# Patient Record
Sex: Female | Born: 1990 | Race: White | Hispanic: No | Marital: Single | Smoking: Never smoker
Health system: Southern US, Community
[De-identification: ages and names within clinical notes are randomized; demographics above are authoritative.]

## PROBLEM LIST (undated history)

## (undated) DIAGNOSIS — S82899A Other fracture of unspecified lower leg, initial encounter for closed fracture: Secondary | ICD-10-CM

## (undated) DIAGNOSIS — S82891A Other fracture of right lower leg, initial encounter for closed fracture: Secondary | ICD-10-CM

## (undated) HISTORY — DX: Other fracture of unspecified lower leg, initial encounter for closed fracture: S82.899A

## (undated) HISTORY — DX: Other fracture of right lower leg, initial encounter for closed fracture: S82.891A

---
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
## 2012-02-05 ENCOUNTER — Encounter: Payer: Self-pay | Admitting: Family Medicine

## 2012-02-05 ENCOUNTER — Ambulatory Visit (INDEPENDENT_AMBULATORY_CARE_PROVIDER_SITE_OTHER): Payer: Commercial Managed Care - PPO | Admitting: Family Medicine

## 2012-02-05 VITALS — BP 129/83 | HR 69 | Temp 97.5°F | Ht 64.0 in | Wt 128.2 lb

## 2012-02-05 DIAGNOSIS — R21 Rash and other nonspecific skin eruption: Secondary | ICD-10-CM

## 2012-02-05 MED ORDER — TRIAMCINOLONE ACETONIDE 0.1 % EX CREA: TOPICAL_CREAM | Freq: Two times a day (BID) | CUTANEOUS | Status: DC

## 2012-02-05 NOTE — Assessment & Plan Note (Addendum)
Rash consistent with keratosis pilaris, however pruritis not consistent with this.  Doesn't look like bug bites. Will treat as KP, recommend claritin daily for itching, benadryl at night, TCI cream for pruritic patches. If roughness continues with improved itching, consider urea cream. rec no shaving for 2 wks while on steroid, rec using disposable razors afterwards.

## 2012-02-05 NOTE — Progress Notes (Signed)
  Subjective:    Patient ID: Kim Zamora, female    DOB: 01-02-1991, 21 y.o.   MRN: [id number]  HPI CC: leg rash  Itchy bumps on legs, started beginning of July.  Though allergic to something, but hasn't noticed worse with any specific exposure.  Changed razors but that didn't help.  Tried cortisone 10 which did help initially with itch.  Tried benadryl oral which did help itching and helped her sleep.  No new lotions, detergents, soaps, shampoos.  No outside exposures.  Is outside a lot.  No new medicines, no new foods.  Went camping father's day weekend, but not since.  No home contacts with similar rash.  Limited to right above knee and downward to ankles, not on feet.  Not on soles of feet.  Not on upper extremities, trunk ,abd , back, chest or face.  Rash tends to clear then returns.  No viral illness, fevers/chills, joint pains, oral lesions, abd pain, nausea.  Is CNA.  Review of Systems Per HPI    Objective:   Physical Exam WDWN CF NAD Diffuse faint pruritic papular rash bilateral lower legs throughout to ankles.  Spares palms/soles.  Some rough papules on upper arms but not pruritic.    Assessment & Plan:

## 2012-02-05 NOTE — Patient Instructions (Signed)
I think this is something called keratosis pilaris. Use claritin daily for itch, may use some benadryl at night. Use triacinolone cream for itchy spots, but no more than 2 weeks in a row.   If not improving, let me know.

## 2012-07-02 ENCOUNTER — Ambulatory Visit (INDEPENDENT_AMBULATORY_CARE_PROVIDER_SITE_OTHER): Payer: Commercial Managed Care - PPO | Admitting: Family Medicine

## 2012-07-02 ENCOUNTER — Encounter: Payer: Self-pay | Admitting: Family Medicine

## 2012-07-02 VITALS — BP 110/74 | HR 60 | Temp 98.1°F | Ht 64.0 in | Wt 138.2 lb

## 2012-07-02 DIAGNOSIS — S93409A Sprain of unspecified ligament of unspecified ankle, initial encounter: Secondary | ICD-10-CM

## 2012-07-02 NOTE — Patient Instructions (Addendum)
Rest, ice, compression, elevation (RICE) therapy.  Home exercise plan as reviewed. Wear air cast until follow up. Ibuprofen as needed for pain. Follow up in 1 week.. Call sooner if symptoms worsening.

## 2012-07-02 NOTE — Assessment & Plan Note (Signed)
Frequent ankle injuries. Natural history and expected course discussed. Questions answered.  Rest, ice, compression, elevation (RICE) therapy.  Home exercise plan outlined.  NSAIDs per medication orders.  Air cast F/u in 1 week.

## 2012-07-02 NOTE — Progress Notes (Signed)
  Subjective:    Patient ID: Kim Zamora, female    DOB: 23-Jul-1990, 21 y.o.   MRN: [id number]  Ankle Pain  The incident occurred 3 to 5 days ago. The incident occurred at home (during running, ankle rolled latereally, heard a pop, initially was not able to weight bear, now is able to more but is shacky). The injury mechanism was an inversion injury. The pain is present in the left ankle. The quality of the pain is described as aching. The pain is at a severity of 7/10. The pain is moderate. The pain has been constant since onset. Associated symptoms include an inability to bear weight and a loss of motion. Pertinent negatives include no loss of sensation, numbness or tingling. Associated symptoms comments: Initial inabilty to bear weight but can now. The symptoms are aggravated by movement. She has tried ice, NSAIDs, non-weight bearing and rest for the symptoms. The treatment provided moderate relief.   10/2010 seen by Dr. Patsy Lager.. Ankle sprain, conservative measures. 2012 left ankle second degree sprain .  Seen at urgent care. Given boot at that time for 2 weeks.     Review of Systems  Constitutional: Negative for fever and fatigue.  Musculoskeletal: Positive for joint swelling. Negative for myalgias.  Neurological: Negative for tingling, syncope and numbness.       Objective:   Physical Exam  Constitutional: She appears well-developed.  Eyes: Pupils are equal, round, and reactive to light.  Cardiovascular: Normal rate and regular rhythm.  Exam reveals no gallop and no friction rub.   No murmur heard. Pulmonary/Chest: Effort normal and breath sounds normal.  Musculoskeletal:       Left foot: She exhibits decreased range of motion, tenderness and swelling. She exhibits no bony tenderness.       ttp anterior and inferior to lateral malleolus, no posterior pain, ankle joint fairly stable, minimal pain to palpation.          Assessment & Plan:

## 2012-07-22 ENCOUNTER — Ambulatory Visit (INDEPENDENT_AMBULATORY_CARE_PROVIDER_SITE_OTHER): Payer: Commercial Managed Care - PPO | Admitting: Internal Medicine

## 2012-07-22 ENCOUNTER — Encounter: Payer: Self-pay | Admitting: Internal Medicine

## 2012-07-22 VITALS — BP 100/68 | HR 72 | Temp 98.6°F | Wt 125.0 lb

## 2012-07-22 DIAGNOSIS — J029 Acute pharyngitis, unspecified: Secondary | ICD-10-CM

## 2012-07-22 DIAGNOSIS — J039 Acute tonsillitis, unspecified: Secondary | ICD-10-CM | POA: Insufficient documentation

## 2012-07-22 NOTE — Assessment & Plan Note (Signed)
Intermediate risk Rapid test negative Discussed supportive care Will check culture Penicillin if positive

## 2012-07-22 NOTE — Progress Notes (Signed)
  Subjective:    Patient ID: Kim Zamora, female    DOB: 01-Aug-1990, 22 y.o.   MRN: [id number]  HPI Here with sore throat Noticed white patches on the sides this AM (does get "little balls" at times there)  Started yesterday and worse today Pain with talking or swallowing Mostly on right side---can feel node there Slight cough No fever No sig nasal congestion or ear pain  Tried aleve---not really helpful  No current outpatient prescriptions on file prior to visit.    No Known Allergies  Past Medical History  Diagnosis Date  . Ankle fracture, right     just casted  . Fracture, ankle   . Fx ankle     rigth ankle fx --just casted    No past surgical history on file.  No family history on file.  History   Social History  . Marital Status: Single    Spouse Name: N/A    Number of Children: N/A  . Years of Education: N/A   Occupational History  . Student     [employer]   Social History Main Topics  . Smoking status: Never Smoker   . Smokeless tobacco: Never Used  . Alcohol Use: No  . Drug Use: No  . Sexually Active: Not on file   Other Topics Concern  . 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 No distress.  HENT:       No sinus tenderness Mild nasal congestion TMs normal 2-3+ tonsillar enlargement with sig injection and some exudate. No petechiae  Neck: Normal range of motion. Neck supple.       ?slight non tender anterior cervical nodes  Pulmonary/Chest: Effort normal and breath sounds normal. No respiratory distress. She has no wheezes. She has no rales.  Skin: No rash noted.          Assessment & Plan:

## 2012-07-23 ENCOUNTER — Encounter: Payer: Self-pay | Admitting: Internal Medicine

## 2012-07-23 ENCOUNTER — Telehealth: Payer: Self-pay

## 2012-07-23 ENCOUNTER — Ambulatory Visit (INDEPENDENT_AMBULATORY_CARE_PROVIDER_SITE_OTHER): Payer: Commercial Managed Care - PPO | Admitting: Internal Medicine

## 2012-07-23 VITALS — BP 100/70 | HR 96 | Temp 98.9°F | Wt 125.0 lb

## 2012-07-23 DIAGNOSIS — J039 Acute tonsillitis, unspecified: Secondary | ICD-10-CM

## 2012-07-23 MED ORDER — METHYLPREDNISOLONE ACETATE 40 MG/ML IJ SUSP
40.0000 mg | Freq: Once | INTRAMUSCULAR | Status: AC
  Administered 2012-07-23: 40 mg via INTRAMUSCULAR

## 2012-07-23 MED ORDER — CEFTRIAXONE SODIUM 1 G IJ SOLR
1.0000 g | Freq: Once | INTRAMUSCULAR | Status: AC
  Administered 2012-07-23: 1 g via INTRAMUSCULAR

## 2012-07-23 MED ORDER — CLINDAMYCIN HCL 300 MG PO CAPS: 300.0000 mg | ORAL_CAPSULE | Freq: Three times a day (TID) | ORAL | Status: DC

## 2012-07-23 NOTE — Progress Notes (Signed)
  Subjective:    Patient ID: Kim Zamora, female    DOB: [birth year], 22 y.o.   MRN: [id number]  HPI Developed fever last night Woke this AM and her throat was much worse Right side of throat feels "shut off" Tonsil is very large  Able to swallow but very painful Has been spitting out saliva No SOB  No current outpatient prescriptions on file prior to visit.    No Known Allergies  Past Medical History  Diagnosis Date  . Ankle fracture, right     just casted  . Fracture, ankle   . Fx ankle     rigth ankle fx --just casted    No past surgical history on file.  No family history on file.  History   Social History  . Marital Status: Single    Spouse Name: N/A    Number of Children: N/A  . Years of Education: N/A   Occupational History  . Student     [employer]   Social History Main Topics  . Smoking status: Never Smoker   . Smokeless tobacco: Never Used  . Alcohol Use: No  . Drug Use: No  . Sexually Active: Not on file   Other Topics Concern  . 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       Looks mildly anxious  HENT:       Sig progression of right tonsillar enlargement Marked exudate on right No clear abscess on pillar but tonsil extends posteriorly  Neck: Normal range of motion.       Tender right cervical node  Pulmonary/Chest: Effort normal and breath sounds normal. No respiratory distress. She has no wheezes. She has no rales.  Lymphadenopathy:    She has cervical adenopathy.          Assessment & Plan:

## 2012-07-23 NOTE — Addendum Note (Signed)
Addended by: Sueanne Margarita on: 07/23/2012 09:35 AM   Modules accepted: Orders

## 2012-07-23 NOTE — Telephone Encounter (Signed)
Pt seen 07/22/12 with sorethroat, last night pt had fever 100; pt said rt side of throat is more swollen; feels like throat has closed off;having difficulty in swallowing and talking. Pt to see Dr Alphonsus Sias this morning at 8:45 am.

## 2012-07-23 NOTE — Patient Instructions (Addendum)
Please set up ENT referral for today

## 2012-07-23 NOTE — Assessment & Plan Note (Signed)
Marked progression No clear abscess but this needs to be considered Will give rocephin and depomedrol IM rx for clinda  ENT today to see if abscess present and aspirate prn

## 2012-08-25 ENCOUNTER — Other Ambulatory Visit: Payer: Self-pay

## 2012-11-04 ENCOUNTER — Encounter: Payer: Self-pay | Admitting: *Deleted

## 2012-11-04 ENCOUNTER — Encounter: Payer: Self-pay | Admitting: Internal Medicine

## 2012-11-04 ENCOUNTER — Ambulatory Visit (INDEPENDENT_AMBULATORY_CARE_PROVIDER_SITE_OTHER): Payer: Commercial Managed Care - PPO | Admitting: Internal Medicine

## 2012-11-04 VITALS — BP 110/70 | HR 81 | Temp 98.8°F | Wt 115.0 lb

## 2012-11-04 DIAGNOSIS — J039 Acute tonsillitis, unspecified: Secondary | ICD-10-CM

## 2012-11-04 MED ORDER — PENICILLIN V POTASSIUM 500 MG PO TABS: 500.0000 mg | ORAL_TABLET | Freq: Three times a day (TID) | ORAL | Status: DC

## 2012-11-04 NOTE — Assessment & Plan Note (Signed)
Strep is positive this time Fairly classic presentation Will Rx with pcn

## 2012-11-04 NOTE — Progress Notes (Signed)
  Subjective:    Patient ID: Kim Zamora, female    DOB: 19-Mar-1991, 22 y.o.   MRN: [id number]  HPI Having sore throat--at first thought it was allergies Swollen glands on right neck Voice is off Intermittent fever---better with meds  No cough No sig nasal congestion or rhinorrhea No ear pain Some headache Used some nyquil last night  No current outpatient prescriptions on file prior to visit.   No current facility-administered medications on file prior to visit.    No Known Allergies  Past Medical History  Diagnosis Date  . Ankle fracture, right     just casted  . Fracture, ankle   . Fx ankle     rigth ankle fx --just casted    No past surgical history on file.  No family history on file.  History   Social History  . Marital Status: Single    Spouse Name: N/A    Number of Children: N/A  . Years of Education: N/A   Occupational History  .           Social History Main Topics  . Smoking status: Never Smoker   . Smokeless tobacco: Never Used  . Alcohol Use: No  . Drug Use: No  . Sexually Active: Not on file   Other Topics Concern  . 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 No distress.  HENT:  Head: Normocephalic and atraumatic.  Nose: Nose normal.  TMs normal Tonsils 2+ with exudates No asymmetry in tonsillar pillars  Neck: Normal range of motion. Neck supple.  Small slightly tender right ant cervical nodes  Pulmonary/Chest: Effort normal and breath sounds normal. No respiratory distress. She has no wheezes. She has no rales.  Lymphadenopathy:    She has cervical adenopathy.  Skin: No rash noted.          Assessment & Plan:

## 2013-04-21 ENCOUNTER — Emergency Department: Payer: Self-pay | Admitting: Emergency Medicine

## 2013-04-21 LAB — CBC
HGB: 13.5 g/dL (ref 12.0–16.0)
MCH: 30.8 pg (ref 26.0–34.0)
MCHC: 34.4 g/dL (ref 32.0–36.0)
MCV: 89 fL (ref 80–100)
Platelet: 363 10*3/uL (ref 150–440)
RBC: 4.38 10*6/uL (ref 3.80–5.20)
RDW: 12.8 % (ref 11.5–14.5)
WBC: 13.5 10*3/uL — ABNORMAL HIGH (ref 3.6–11.0)

## 2013-04-21 LAB — BASIC METABOLIC PANEL
Calcium, Total: 9.5 mg/dL (ref 8.5–10.1)
Co2: 25 mmol/L (ref 21–32)
EGFR (African American): >60
Glucose: 97 mg/dL (ref 65–99)
Sodium: 138 mmol/L (ref 136–145)

## 2013-04-21 LAB — URINALYSIS, COMPLETE
Bacteria: NONE SEEN
Blood: NEGATIVE
Ketone: NEGATIVE
Leukocyte Esterase: NEGATIVE
Protein: NEGATIVE
RBC,UR: 1 /HPF (ref 0–5)

## 2013-04-21 LAB — PREGNANCY, URINE: Pregnancy Test, Urine: NEGATIVE m[IU]/mL

## 2013-04-22 ENCOUNTER — Ambulatory Visit (INDEPENDENT_AMBULATORY_CARE_PROVIDER_SITE_OTHER): Payer: Commercial Managed Care - PPO | Admitting: Internal Medicine

## 2013-04-22 ENCOUNTER — Encounter: Payer: Self-pay | Admitting: Internal Medicine

## 2013-04-22 VITALS — BP 122/84 | HR 71 | Temp 97.3°F | Ht 64.0 in | Wt 122.1 lb

## 2013-04-22 DIAGNOSIS — A084 Viral intestinal infection, unspecified: Secondary | ICD-10-CM

## 2013-04-22 DIAGNOSIS — R109 Unspecified abdominal pain: Secondary | ICD-10-CM

## 2013-04-22 DIAGNOSIS — A088 Other specified intestinal infections: Secondary | ICD-10-CM

## 2013-04-22 DIAGNOSIS — K3189 Other diseases of stomach and duodenum: Secondary | ICD-10-CM

## 2013-04-22 LAB — GC/CHLAMYDIA PROBE AMP

## 2013-04-22 LAB — WET PREP, GENITAL

## 2013-04-22 NOTE — Progress Notes (Signed)
  Subjective:    Patient ID: Kim Zamora, female    DOB: 11/03/1990, 22 y.o.   MRN: [id number]  HPI    Review of Systems     Objective:   Physical Exam        Assessment & Plan:

## 2013-04-22 NOTE — Patient Instructions (Addendum)
Abdominal Pain Abdominal pain can be caused by many things. Your caregiver decides the seriousness of your pain by an examination and possibly blood tests and X-rays. Many cases can be observed and treated at home. Most abdominal pain is not caused by a disease and will probably improve without treatment. However, in many cases, more time must pass before a clear cause of the pain can be found. Before that point, it may not be known if you need more testing, or if hospitalization or surgery is needed. HOME CARE INSTRUCTIONS   Do not take laxatives unless directed by your caregiver.  Take pain medicine only as directed by your caregiver.  Only take over-the-counter or prescription medicines for pain, discomfort, or fever as directed by your caregiver.  Try a clear liquid diet (broth, tea, or water) for as long as directed by your caregiver. Slowly move to a bland diet as tolerated. SEEK IMMEDIATE MEDICAL CARE IF:   The pain does not go away.  You have a fever.  You keep throwing up (vomiting).  The pain is felt only in portions of the abdomen. Pain in the right side could possibly be appendicitis. In an adult, pain in the left lower portion of the abdomen could be colitis or diverticulitis.  You pass bloody or black tarry stools. MAKE SURE YOU:   Understand these instructions.  Will watch your condition.  Will get help right away if you are not doing well or get worse. Document Released: 04/05/2005 Document Revised: 09/18/2011 Document Reviewed: 02/12/2008 [facility] Patient Information 2014 [place], [place]. Viral Gastroenteritis Viral gastroenteritis is also known as stomach flu. This condition affects the stomach and intestinal tract. It can cause sudden diarrhea and vomiting. The illness typically lasts 3 to 8 days. Most people develop an immune response that eventually gets rid of the virus. While this natural response develops, the virus can make you quite ill. CAUSES  Many  different viruses can cause gastroenteritis, such as rotavirus or noroviruses. You can catch one of these viruses by consuming contaminated food or water. You may also catch a virus by sharing utensils or other personal items with an infected person or by touching a contaminated surface. SYMPTOMS  The most common symptoms are diarrhea and vomiting. These problems can cause a severe loss of body fluids (dehydration) and a body salt (electrolyte) imbalance. Other symptoms may include:  Fever.  Headache.  Fatigue.  Abdominal pain. DIAGNOSIS  Your caregiver can usually diagnose viral gastroenteritis based on your symptoms and a physical exam. A stool sample may also be taken to test for the presence of viruses or other infections. TREATMENT  This illness typically goes away on its own. Treatments are aimed at rehydration. The most serious cases of viral gastroenteritis involve vomiting so severely that you are not able to keep fluids down. In these cases, fluids must be given through an intravenous line (IV). HOME CARE INSTRUCTIONS   Drink enough fluids to keep your urine clear or pale yellow. Drink small amounts of fluids frequently and increase the amounts as tolerated.  Ask your caregiver for specific rehydration instructions.  Avoid:  Foods high in sugar.  Alcohol.  Carbonated drinks.  Tobacco.  Juice.  Caffeine drinks.  Extremely hot or cold fluids.  Fatty, greasy foods.  Too much intake of anything at one time.  Dairy products until 24 to 48 hours after diarrhea stops.  You may consume probiotics. Probiotics are active cultures of beneficial bacteria. They may lessen the amount and  number of diarrheal stools in adults. Probiotics can be found in yogurt with active cultures and in supplements.  Wash your hands well to avoid spreading the virus.  Only take over-the-counter or prescription medicines for pain, discomfort, or fever as directed by your caregiver. Do not  give aspirin to children. Antidiarrheal medicines are not recommended.  Ask your caregiver if you should continue to take your regular prescribed and over-the-counter medicines.  Keep all follow-up appointments as directed by your caregiver. SEEK IMMEDIATE MEDICAL CARE IF:   You are unable to keep fluids down.  You do not urinate at least once every 6 to 8 hours.  You develop shortness of breath.  You notice blood in your stool or vomit. This may look like coffee grounds.  You have abdominal pain that increases or is concentrated in one small area (localized).  You have persistent vomiting or diarrhea.  You have a fever.  The patient is a child younger than 3 months, and he or she has a fever.  The patient is a child older than 3 months, and he or she has a fever and persistent symptoms.  The patient is a child older than 3 months, and he or she has a fever and symptoms suddenly get worse.  The patient is a baby, and he or she has no tears when crying. MAKE SURE YOU:   Understand these instructions.  Will watch your condition.  Will get help right away if you are not doing well or get worse. Document Released: 06/26/2005 Document Revised: 09/18/2011 Document Reviewed: 04/12/2011 [facility] Patient Information 2014 [place], [place].

## 2013-04-22 NOTE — Progress Notes (Signed)
HPI: Pt presents to the office today with one day onset of stomach pain. She was seen in [facility] ER last night and was told she has a viral stomach infection. After receiving IV fluids and medications she was sent home with a prescription for Percocet 5/325mg  and Zofran 4 mg, neither she has filled yet. Pt reports having a urine screen which was negative for UTI or pregnancy and CT scan that was negative for Appendcitis or other gastro complications. Pt denies fever, chills, or body aches. Pt endorses stomach pain to right lower side, nausea, and diarrhea. Pt states her pain 5/10, but not as constant as it was last night.  Past Medical History  Diagnosis Date  . Ankle fracture, right     just casted  . Fracture, ankle   . Fx ankle     rigth ankle fx --just casted     No Known Allergies  History reviewed. No pertinent family history.  ROS:  Constitutional: Denies fever, malaise, fatigue, headache or abrupt weight changes.   Gastrointestinal: Endorses abdominal pain, diarrhea, and nausea.  Denies vomiting, bloating, or blood in the stool.  GU: Denies frequency, urgency, pain with urination, blood in urine, odor or discharge.   No other specific complaints in a complete review of systems (except as listed in HPI above).  PE:  BP 122/84  Pulse 71  Temp(Src) 97.3 F (36.3 C) (Oral)  Ht 5\' 4"  (1.626 m)  Wt 122 lb 2 oz (55.396 kg)  BMI 20.95 kg/m2  SpO2 97% Wt Readings from Last 3 Encounters:  04/22/13 122 lb 2 oz (55.396 kg)  11/04/12 115 lb (52.164 kg) (24%*, Z = -0.70)  07/23/12 125 lb (56.7 kg) (45%*, Z = -0.11)   * Growth percentiles are based on CDC 2-20 Years data.    General: Appears their stated age, well developed, well nourished in NAD. Abdomen: Soft and tender upon palpation to LUQ with referred pain to RLQ. Psoas test negative for pain.  Normal bowel sounds, no bruits noted. No distention or masses noted. Liver, spleen and kidneys non palpable. No CVA  tenderness Psychiatric: Mood and affect normal. Behavior is normal. Judgment and thought content normal.    Assessment and Plan: Viral Gastroenteritis? Increase fluid intake to maintain hydration Try OTC Imodium 2mg  PO tablet as needed for diarrhea Take pain and nausea medications as previously prescribed Monitor for fever, chills, or worsening symptoms, return to ER if symptoms become severe Follow up in 3-5 days if no improvement or worsening of symptoms  Rory Montel S, Student-NP

## 2013-04-22 NOTE — Progress Notes (Signed)
Subjective:    Patient ID: Kim Zamora, female    DOB: 12-22-1990, 22 y.o.   MRN: [id number]  HPI  Pt presents to the clinic today with c/o stomach pain. She did have some associated diarrhea. This started yesterday. She did go the [facility] ER last night for the same. She did have a CT scan of the abdomen which was normal. Her urinalysis was normal. They told her that she had a virus- gave her percocet and zofran and discharged her home. She denies fever, chills or body aches. She reports her symptoms are about the same.   Review of Systems      Past Medical History  Diagnosis Date  . Ankle fracture, right     just casted  . Fracture, ankle   . Fx ankle     rigth ankle fx --just casted    Current Outpatient Prescriptions  Medication Sig Dispense Refill  . penicillin v potassium (VEETID) 500 MG tablet Take 1 tablet (500 mg total) by mouth 3 (three) times daily.  30 tablet  0   No current facility-administered medications for this visit.    No Known Allergies  History reviewed. No pertinent family history.  History   Social History  . Marital Status: Single    Spouse Name: N/A    Number of Children: N/A  . Years of Education: N/A   Occupational History  .           Social History Main Topics  . Smoking status: Never Smoker   . Smokeless tobacco: Never Used  . Alcohol Use: No  . Drug Use: No  . Sexual Activity: Not on file   Other Topics Concern  . 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  Gastrointestinal: Pt reports abdominal pain. Denies bloating, constipation, diarrhea or blood in the stool.  GU: Denies urgency, frequency, pain with urination, burning sensation, blood in urine, odor or discharge.   No other specific complaints in a complete review of systems (except as listed in HPI  above).  Objective:   Physical Exam   BP 122/84  Pulse 71  Temp(Src) 97.3 F (36.3 C) (Oral)  Ht 5\' 4"  (1.626 m)  Wt 122 lb 2 oz (55.396 kg)  BMI 20.95 kg/m2  SpO2 97% Wt Readings from Last 3 Encounters:  04/22/13 122 lb 2 oz (55.396 kg)  11/04/12 115 lb (52.164 kg) (24%*, Z = -0.70)  07/23/12 125 lb (56.7 kg) (45%*, Z = -0.11)   * Growth percentiles are based on CDC 2-20 Years data.    General: Appears her stated age, well developed, well nourished in NAD.  Cardiovascular: Normal rate and rhythm. S1,S2 noted.  No murmur, rubs or gallops noted. No JVD or BLE edema. No carotid bruits noted. Pulmonary/Chest: Normal effort and positive vesicular breath sounds. No respiratory distress. No wheezes, rales or ronchi noted.  Abdomen: Soft and tender in the referred RLQ with palpation of the LLQ. Normal bowel sounds, no bruits noted. No distention or masses noted. Liver, spleen and kidneys non palpable. Negative Psoas.        Assessment & Plan:   Viral Gastroenteritis:  Drink plenty of fluids to avoid dehydration Continue zofran as needed Try no to take percocet unless you really needed Do not have CT scan report- still some concern for appendicitis  Watch for fever, worsening abdominal pain, nausea or vomiting  RTC if you notice any symptoms noted above

## 2013-05-15 ENCOUNTER — Other Ambulatory Visit: Payer: Self-pay

## 2013-07-02 ENCOUNTER — Ambulatory Visit (INDEPENDENT_AMBULATORY_CARE_PROVIDER_SITE_OTHER): Payer: Commercial Managed Care - PPO | Admitting: Family Medicine

## 2013-07-02 ENCOUNTER — Encounter: Payer: Self-pay | Admitting: Family Medicine

## 2013-07-02 VITALS — BP 102/68 | HR 72 | Temp 98.9°F | Ht 64.0 in | Wt 119.8 lb

## 2013-07-02 DIAGNOSIS — J019 Acute sinusitis, unspecified: Secondary | ICD-10-CM

## 2013-07-02 DIAGNOSIS — R0789 Other chest pain: Secondary | ICD-10-CM | POA: Insufficient documentation

## 2013-07-02 DIAGNOSIS — B9689 Other specified bacterial agents as the cause of diseases classified elsewhere: Secondary | ICD-10-CM | POA: Insufficient documentation

## 2013-07-02 DIAGNOSIS — R071 Chest pain on breathing: Secondary | ICD-10-CM

## 2013-07-02 MED ORDER — AMOXICILLIN-POT CLAVULANATE 875-125 MG PO TABS: 1.0000 | ORAL_TABLET | Freq: Two times a day (BID) | ORAL | Status: DC

## 2013-07-02 MED ORDER — GUAIFENESIN-CODEINE 100-10 MG/5ML PO SYRP: 5.0000 mL | ORAL_SOLUTION | Freq: Four times a day (QID) | ORAL | Status: DC | PRN

## 2013-07-02 NOTE — Patient Instructions (Signed)
Drink fluids  Use nasal saline spray  Take augmentin for sinus infection Heat on the rib area that hurts and also ibuprofen over the counter Robitussin with codeine for cough with caution  Update if not starting to improve in a week or if worsening

## 2013-07-02 NOTE — Progress Notes (Signed)
   Subjective:    Patient ID: Kim Zamora, female    DOB: 15-Oct-1990, 22 y.o.   MRN: [id number]  HPI Here with uri symptoms  Congestion for 2 weeks with bad post nasal drip  Cough - feels like it should be productive - but nothing is coming out  Mucous is yellow   No fever  Had some pain in face under her eyes   Throat was sore -improved now  Ears feel fine   L side of ribs feel sore -especially when twisting/ reaching or coughing  They seem to be sticking out more No trauma  occ back pain   Patient Active Problem List   Diagnosis Date Noted  . Ovarian cyst 11/14/2010   Past Medical History  Diagnosis Date  . Ankle fracture, right     just casted  . Fracture, ankle   . Fx ankle     rigth ankle fx --just casted   No past surgical history on file. History  Substance Use Topics  . Smoking status: Never Smoker   . Smokeless tobacco: Never Used  . Alcohol Use: No   No family history on file. Allergies  Allergen Reactions  . Promethazine    No current outpatient prescriptions on file prior to visit.   No current facility-administered medications on file prior to visit.       Review of Systems Review of Systems  Constitutional: Negative for fever, appetite change, fatigue and unexpected weight change.  Eyes: Negative for pain and visual disturbance. ENt pos for cong / rhinorrhea/ sinus pain and ST  Respiratory: Negative for wheeze and shortness of breath.  pos for chest wall pain  Cardiovascular: Negative for palpitations    Gastrointestinal: Negative for nausea, diarrhea and constipation.  Genitourinary: Negative for urgency and frequency.  Skin: Negative for pallor or rash   Neurological: Negative for weakness, light-headedness, numbness and headaches.  Hematological: Negative for adenopathy. Does not bruise/bleed easily.  Psychiatric/Behavioral: Negative for dysphoric mood. The patient is not nervous/anxious.         Objective:   Physical Exam    Constitutional: She appears well-developed and well-nourished. No distress.  HENT:  Head: Normocephalic and atraumatic.  Right Ear: External ear normal.  Left Ear: External ear normal.  Mouth/Throat: Oropharynx is clear and moist. No oropharyngeal exudate.  Nares are injected and congested   Bilateral maxillary sinus tenderness Purulent post nasal drip   Eyes: Conjunctivae and EOM are normal. Pupils are equal, round, and reactive to light. Right eye exhibits no discharge. Left eye exhibits no discharge.  Neck: Normal range of motion. Neck supple.  Cardiovascular: Normal rate and regular rhythm.   Pulmonary/Chest: Effort normal and breath sounds normal. No respiratory distress. She has no wheezes. She has no rales. She exhibits tenderness.  Tender over L lateral ribs without ecchymosis/ skin change or crepitus   Musculoskeletal: She exhibits no edema.  Lymphadenopathy:    She has no cervical adenopathy.  Neurological: She is alert. She displays normal reflexes.  Skin: Skin is warm and dry. No rash noted.  Psychiatric: She has a normal mood and affect.          Assessment & Plan:

## 2013-07-02 NOTE — Progress Notes (Signed)
Pre-visit discussion using our clinic review tool. No additional management support is needed unless otherwise documented below in the visit note.  

## 2013-07-03 NOTE — Assessment & Plan Note (Signed)
On L side  Disc use of warm compress/ nsaid  Reminded to take deep breaths to prev atelectasis Update if not starting to improve in a week or if worsening  -esp if cough or fever

## 2013-07-03 NOTE — Assessment & Plan Note (Signed)
Cover with augmentin  Disc symptomatic care - see instructions on AVS  Update if not starting to improve in a week or if worsening   

## 2013-07-08 ENCOUNTER — Encounter: Payer: Self-pay | Admitting: Family Medicine

## 2013-07-08 ENCOUNTER — Ambulatory Visit (INDEPENDENT_AMBULATORY_CARE_PROVIDER_SITE_OTHER): Payer: Commercial Managed Care - PPO | Admitting: Family Medicine

## 2013-07-08 VITALS — BP 98/66 | HR 97 | Temp 99.9°F | Wt 120.8 lb

## 2013-07-08 DIAGNOSIS — J019 Acute sinusitis, unspecified: Secondary | ICD-10-CM

## 2013-07-08 DIAGNOSIS — B9689 Other specified bacterial agents as the cause of diseases classified elsewhere: Secondary | ICD-10-CM

## 2013-07-08 DIAGNOSIS — R05 Cough: Secondary | ICD-10-CM

## 2013-07-08 LAB — POCT INFLUENZA A/B: Influenza B, POC: NEGATIVE

## 2013-07-08 MED ORDER — BENZONATATE 200 MG PO CAPS: 200.0000 mg | ORAL_CAPSULE | Freq: Three times a day (TID) | ORAL | Status: DC | PRN

## 2013-07-08 NOTE — Patient Instructions (Signed)
Finish the antibiotics, use the tessalon for cough, tylenol/ibuprofen for fever.   Get some rest and drink plenty of fluids.  This should gradually get better.

## 2013-07-08 NOTE — Assessment & Plan Note (Addendum)
Likely improved, with a secondary illness likely.  Flu neg.  Likely a viral process, prevalent in community.  Would use tessalon prn cough and supportive care o/w.  Nontoxic.  D/w pt. She agrees.  Okay for outpatient f/u.  Routed to PCP as FYI.

## 2013-07-08 NOTE — Progress Notes (Signed)
Pre-visit discussion using our clinic review tool. No additional management support is needed unless otherwise documented below in the visit note.  Last OV noted.  Prev sinus pressure and L sided rib pain are gone.  Still on augmentin.  Didn't tolerate the cough medicine, likely from codeine.    Since the last OV, now has a fever up to 101 (started today).  Chills today.  Chest pressure (different from the prev L chest pain prev) with cough.  Cough continues, worse in the meantime.  Cough doesn't hurt her throat, but cough sounds more "raspy".   No vomiting; isolated diarrhea from the augmentin.  No diffuse aches.   Meds, vitals, and allergies reviewed.   ROS: See HPI.  Otherwise, noncontributory.  GEN: nad, alert and oriented HEENT: mucous membranes moist, tm w/o erythema, nasal exam w/o erythema, clear discharge noted,  OP with cobblestoning, sinuses not ttp. NECK: supple w/o LA CV: rrr.   PULM: ctab, no inc wob EXT: no edema SKIN: no acute rash

## 2013-07-28 ENCOUNTER — Encounter: Payer: Self-pay | Admitting: Internal Medicine

## 2013-07-28 ENCOUNTER — Ambulatory Visit (INDEPENDENT_AMBULATORY_CARE_PROVIDER_SITE_OTHER): Payer: Commercial Managed Care - PPO | Admitting: Internal Medicine

## 2013-07-28 VITALS — BP 110/60 | HR 85 | Temp 97.5°F | Ht 64.0 in | Wt 123.0 lb

## 2013-07-28 DIAGNOSIS — Z111 Encounter for screening for respiratory tuberculosis: Secondary | ICD-10-CM

## 2013-07-28 DIAGNOSIS — Z23 Encounter for immunization: Secondary | ICD-10-CM

## 2013-07-28 DIAGNOSIS — Z0000 Encounter for general adult medical examination without abnormal findings: Secondary | ICD-10-CM

## 2013-07-28 NOTE — Assessment & Plan Note (Signed)
Form done Flu shot PPD 4th hep B----3rd wasn't 6 months after first Discussed fitness

## 2013-07-28 NOTE — Progress Notes (Signed)
Subjective:    Patient ID: Kim SalkJessica M Zamora, female    DOB: 01-18-1991, 23 y.o.   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   No current facility-administered medications on file prior to visit.    Allergies  Allergen Reactions  . Codeine     sedation  . Promethazine     Altered mentation    Past Medical History  Diagnosis Date  . Ankle fracture, right     just casted  . Fracture, ankle   . Fx ankle     rigth ankle fx --just casted    No past surgical history on file.  Family History  Problem Relation Age of Onset  . Hypertension Mother     borderline  . Cancer Maternal Grandmother     ovarian  . Cancer Paternal Grandfather     prostate and colon  . Heart disease Neg Hx   . Diabetes Neg Hx     History   Social History  . Marital Status: Single    Spouse Name: N/A    Number of Children: N/A  . Years of Education: N/A   Occupational History  .           Social History Main Topics  . Smoking status: Never Smoker   . Smokeless tobacco: Never Used  . Alcohol Use: No  . Drug Use: No  . Sexual Activity: Not on file   Other Topics Concern  . Not on file   Social History Narrative   [facility]   Then [facility]   Works part time at [employer]   Review of Systems  Constitutional: Negative for fatigue and unexpected weight change.       Wears seat belt No exercise--discussed  HENT: Negative for dental problem, hearing loss and tinnitus.        Regular with dentist  Eyes: Negative for redness and visual disturbance.  Respiratory: Positive for cough. Negative for chest tightness and shortness of breath.        Cough for 6 weeks  Cardiovascular: Positive for chest pain and  palpitations. Negative for leg swelling.       Had chest pain with respiratory illness Occasional racing heart--may cause rest chest pain (a few minutes)  Gastrointestinal: Negative for nausea, vomiting, abdominal pain, constipation and blood in stool.       No heartburn  Endocrine: Negative for cold intolerance and heat intolerance.  Genitourinary: Negative for dysuria, urgency and difficulty urinating.       Gets pain along left flank with micturition (rare). No history of stones Periods are regular. Heavy bleeding and pain. Midol not helpful  Musculoskeletal: Negative for arthralgias, back pain and joint swelling.  Skin: Negative for rash.       No suspicious lesions  Allergic/Immunologic: Negative for environmental allergies.  Neurological: Positive for headaches. Negative for dizziness, syncope, weakness, light-headedness and numbness.       Occasional migraines---uses aleve  Hematological: Negative for adenopathy. Bruises/bleeds easily.       Bruises easy on legs  Psychiatric/Behavioral: Negative for sleep disturbance and dysphoric mood. The patient is not nervous/anxious.        Some stress  Objective:   Physical Exam  Constitutional: She is oriented to person, place, and time. She appears well-developed and well-nourished. No distress.  HENT:  Head: Normocephalic and atraumatic.  Right Ear: External ear normal.  Left Ear: External ear normal.  Mouth/Throat: Oropharynx is clear and moist. No oropharyngeal exudate.  Eyes: Conjunctivae and EOM are normal. Pupils are equal, round, and reactive to light.  Neck: Normal range of motion. Neck supple. No thyromegaly present.  Cardiovascular: Normal rate, regular rhythm, normal heart sounds and intact distal pulses.  Exam reveals no gallop.   No murmur heard. Pulmonary/Chest: Effort normal and breath sounds normal. No respiratory distress. She has no wheezes. She has no rales.  Abdominal: Soft. There is no tenderness.    Musculoskeletal: She exhibits no edema and no tenderness.  Lymphadenopathy:    She has no cervical adenopathy.  Neurological: She is alert and oriented to person, place, and time.  Skin: No rash noted. No erythema.  Psychiatric: She has a normal mood and affect. Her behavior is normal.          Assessment & Plan:

## 2013-07-28 NOTE — Addendum Note (Signed)
Addended by: Sueanne MargaritaSMITH, Arwilda Georgia L on: 07/28/2013 12:58 PM   Modules accepted: Orders

## 2013-07-28 NOTE — Progress Notes (Signed)
Pre-visit discussion using our clinic review tool. No additional management support is needed unless otherwise documented below in the visit note.  

## 2013-07-28 NOTE — Patient Instructions (Signed)
Exercise to Stay Healthy Exercise helps you become and stay healthy. EXERCISE IDEAS AND TIPS Choose exercises that:  You enjoy.  Fit into your day. You do not need to exercise really hard to be healthy. You can do exercises at a slow or medium level and stay healthy. You can:  Stretch before and after working out.  Try yoga, Pilates, or tai chi.  Lift weights.  Walk fast, swim, jog, run, climb stairs, bicycle, dance, or rollerskate.  Take aerobic classes. Exercises that burn about 150 calories:  Running 1  miles in 15 minutes.  Playing volleyball for 45 to 60 minutes.  Washing and waxing a car for 45 to 60 minutes.  Playing touch football for 45 minutes.  Walking 1  miles in 35 minutes.  Pushing a stroller 1  miles in 30 minutes.  Playing basketball for 30 minutes.  Raking leaves for 30 minutes.  Bicycling 5 miles in 30 minutes.  Walking 2 miles in 30 minutes.  Dancing for 30 minutes.  Shoveling snow for 15 minutes.  Swimming laps for 20 minutes.  Walking up stairs for 15 minutes.  Bicycling 4 miles in 15 minutes.  Gardening for 30 to 45 minutes.  Jumping rope for 15 minutes.  Washing windows or floors for 45 to 60 minutes. Document Released: 07/29/2010 Document Revised: 09/18/2011 Document Reviewed: 07/29/2010 ExitCare Patient Information 2014 [employer].  

## 2013-07-29 ENCOUNTER — Encounter: Payer: Self-pay | Admitting: *Deleted

## 2013-07-29 LAB — VARICELLA ZOSTER ANTIBODY, IGG: Varicella IgG: 440.1 Index — ABNORMAL HIGH (ref <135.00)

## 2013-10-17 ENCOUNTER — Telehealth: Payer: Self-pay | Admitting: Internal Medicine

## 2013-10-17 NOTE — Telephone Encounter (Signed)
Patient Information:  Caller Name: Shanda BumpsJessica  Phone: [phone]  Patient: Luanna SalkGinther, Terrisa M  Gender: Female  DOB: [birth year]  Age: [age] Years  PCP: Crawford Givensuncan, Graham Clelia Croft(Shaw) [facility](Family Practice)  Pregnant: No  Office Follow Up:  Does the office need to follow up with this patient?: No  Instructions For The Office: N/A   Symptoms  Reason For Call & Symptoms: Patient calling, she had a massage on 4/9 and now has a knot on the back of her left thigh.  Same is painful. The knot is about the size of a quarter.  The area is a purple color, no red streaks.  Has pain with ambulation "down to my toes".  Reviewed Health History In EMR: Yes  Reviewed Medications In EMR: Yes  Reviewed Allergies In EMR: Yes  Reviewed Surgeries / Procedures: Yes  Date of Onset of Symptoms: 10/16/2013 OB / GYN:  LMP: 10/07/2013  Guideline(s) Used:  Leg Pain  Disposition Per Guideline:   Go to ED Now (or to Office with PCP Approval)  Reason For Disposition Reached:   Thigh or calf pain in only one leg and present > 1 hour  Advice Given:  N/A  Patient Will Follow Care Advice:  YES

## 2013-10-17 NOTE — Telephone Encounter (Signed)
Left detailed message on cell phone with results, advised pt to call if she needs an appt.

## 2013-10-17 NOTE — Telephone Encounter (Signed)
This is likely a superficial blood clot but hard to tell over the phone---thus the ED recommendation. Please check on her on Monday

## 2014-01-28 ENCOUNTER — Ambulatory Visit: Payer: Commercial Managed Care - PPO | Admitting: Internal Medicine

## 2014-01-28 ENCOUNTER — Encounter (HOSPITAL_COMMUNITY): Payer: Self-pay | Admitting: Emergency Medicine

## 2014-01-28 ENCOUNTER — Emergency Department (HOSPITAL_COMMUNITY): Payer: Commercial Managed Care - PPO

## 2014-01-28 ENCOUNTER — Emergency Department (HOSPITAL_COMMUNITY)
Admission: EM | Admit: 2014-01-28 | Discharge: 2014-01-28 | Disposition: A | Discharge status: Home / Self Care | Payer: Commercial Managed Care - PPO | Attending: Emergency Medicine | Admitting: Emergency Medicine

## 2014-01-28 DIAGNOSIS — S060X1A Concussion with loss of consciousness of 30 minutes or less, initial encounter: Secondary | ICD-10-CM

## 2014-01-28 DIAGNOSIS — Y92410 Unspecified street and highway as the place of occurrence of the external cause: Secondary | ICD-10-CM | POA: Insufficient documentation

## 2014-01-28 DIAGNOSIS — S20219A Contusion of unspecified front wall of thorax, initial encounter: Secondary | ICD-10-CM

## 2014-01-28 DIAGNOSIS — S0990XA Unspecified injury of head, initial encounter: Secondary | ICD-10-CM

## 2014-01-28 DIAGNOSIS — Y9389 Activity, other specified: Secondary | ICD-10-CM | POA: Insufficient documentation

## 2014-01-28 DIAGNOSIS — Z8781 Personal history of (healed) traumatic fracture: Secondary | ICD-10-CM | POA: Insufficient documentation

## 2014-01-28 MED ORDER — OXYCODONE-ACETAMINOPHEN 5-325 MG PO TABS
2.0000 | ORAL_TABLET | Freq: Once | ORAL | Status: AC
Start: 2014-01-28 — End: 2014-01-28
  Administered 2014-01-28: 2 via ORAL
  Filled 2014-01-28: qty 2

## 2014-01-28 MED ORDER — SODIUM CHLORIDE 0.9 % IV BOLUS (SEPSIS)
1000.0000 mL | Freq: Once | INTRAVENOUS | Status: AC
  Administered 2014-01-28: 1000 mL via INTRAVENOUS

## 2014-01-28 MED ORDER — IBUPROFEN 800 MG PO TABS: 800.0000 mg | ORAL_TABLET | Freq: Three times a day (TID) | ORAL | Status: AC | PRN

## 2014-01-28 MED ORDER — ONDANSETRON HCL 4 MG PO TABS: 4.0000 mg | ORAL_TABLET | Freq: Four times a day (QID) | ORAL | Status: AC

## 2014-01-28 MED ORDER — IBUPROFEN 800 MG PO TABS
800.0000 mg | ORAL_TABLET | Freq: Once | ORAL | Status: AC
  Administered 2014-01-28: 800 mg via ORAL
  Filled 2014-01-28: qty 1

## 2014-01-28 MED ORDER — OXYCODONE-ACETAMINOPHEN 5-325 MG PO TABS: 1.0000 | ORAL_TABLET | ORAL | Status: AC | PRN

## 2014-01-28 MED ORDER — ONDANSETRON 4 MG PO TBDP
4.0000 mg | ORAL_TABLET | Freq: Once | ORAL | Status: AC
  Administered 2014-01-28: 4 mg via ORAL
  Filled 2014-01-28: qty 1

## 2014-01-28 NOTE — ED Notes (Signed)
Md Ward at bedside discussing results.
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
## 2014-01-28 NOTE — ED Notes (Signed)
Pt reports that she is now feeling dizzy and is having tingling to left arm. Md Ward made aware and at bedside assessing. Pt placed in C Collar. PT remains AO x4. VSS.
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
## 2014-01-28 NOTE — Discharge Instructions (Signed)
Chest Contusion A chest contusion is a deep bruise on your chest area. Contusions are the result of an injury that caused bleeding under the skin. A chest contusion may involve bruising of the skin, muscles, or ribs. The contusion may turn blue, purple, or yellow. Minor injuries will give you a painless contusion, but more severe contusions may stay painful and swollen for a few weeks. CAUSES  A contusion is usually caused by a blow, trauma, or direct force to an area of the body. SYMPTOMS   Swelling and redness of the injured area.  Discoloration of the injured area.  Tenderness and soreness of the injured area.  Pain. DIAGNOSIS  The diagnosis can be made by taking a history and performing a physical exam. An X-ray, CT scan, or MRI may be needed to determine if there were any associated injuries, such as broken bones (fractures) or internal injuries. TREATMENT  Often, the best treatment for a chest contusion is resting, icing, and applying cold compresses to the injured area. Deep breathing exercises may be recommended to reduce the risk of pneumonia. Over-the-counter medicines may also be recommended for pain control. HOME CARE INSTRUCTIONS   Put ice on the injured area.  Put ice in a plastic bag.  Place a towel between your skin and the bag.  Leave the ice on for 15-20 minutes, 03-04 times a day.  Only take over-the-counter or prescription medicines as directed by your caregiver. Your caregiver may recommend avoiding anti-inflammatory medicines (aspirin, ibuprofen, and naproxen) for 48 hours because these medicines may increase bruising.  Rest the injured area.  Perform deep-breathing exercises as directed by your caregiver.  Stop smoking if you smoke.  Do not lift objects over 5 pounds (2.3 kg) for 3 days or longer if recommended by your caregiver. SEEK IMMEDIATE MEDICAL CARE IF:   You have increased bruising or swelling.  You have pain that is getting worse.  You have  difficulty breathing.  You have dizziness, weakness, or fainting.  You have blood in your urine or stool.  You cough up or vomit blood.  Your swelling or pain is not relieved with medicines. MAKE SURE YOU:   Understand these instructions.  Will watch your condition.  Will get help right away if you are not doing well or get worse. Document Released: 03/21/2001 Document Revised: 03/20/2012 Document Reviewed: 12/18/2011 [facility] Patient Information 2015 [place] [place]. This information is not intended to replace advice given to you by your health care provider. Make sure you discuss any questions you have with your health care provider.  Motor Vehicle Collision  It is common to have multiple bruises and sore muscles after a motor vehicle collision (MVC). These tend to feel worse for the first 24 hours. You may have the most stiffness and soreness over the first several hours. You may also feel worse when you wake up the first morning after your collision. After this point, you will usually begin to improve with each day. The speed of improvement often depends on the severity of the collision, the number of injuries, and the location and nature of these injuries. HOME CARE INSTRUCTIONS   Put ice on the injured area.  Put ice in a plastic bag.  Place a towel between your skin and the bag.  Leave the ice on for 15-20 minutes, 3-4 times a day, or as directed by your health care provider.  Drink enough fluids to keep your urine clear or pale yellow. Do not drink alcohol.  Take a warm shower or bath once or twice a day. This will increase blood flow to sore muscles.  You may return to activities as directed by your caregiver. Be careful when lifting, as this may aggravate neck or back pain.  Only take over-the-counter or prescription medicines for pain, discomfort, or fever as directed by your caregiver. Do not use aspirin. This may increase bruising and bleeding. SEEK IMMEDIATE  MEDICAL CARE IF:  You have numbness, tingling, or weakness in the arms or legs.  You develop severe headaches not relieved with medicine.  You have severe neck pain, especially tenderness in the middle of the back of your neck.  You have changes in bowel or bladder control.  There is increasing pain in any area of the body.  You have shortness of breath, lightheadedness, dizziness, or fainting.  You have chest pain.  You feel sick to your stomach (nauseous), throw up (vomit), or sweat.  You have increasing abdominal discomfort.  There is blood in your urine, stool, or vomit.  You have pain in your shoulder (shoulder strap areas).  You feel your symptoms are getting worse. MAKE SURE YOU:   Understand these instructions.  Will watch your condition.  Will get help right away if you are not doing well or get worse. Document Released: 06/26/2005 Document Revised: 07/01/2013 Document Reviewed: 11/23/2010 Post-Concussion Syndrome Post-concussion syndrome describes the symptoms that can occur after a head injury. These symptoms can last from weeks to months. CAUSES  It is not clear why some head injuries cause post-concussion syndrome. It can occur whether your head injury was mild or severe and whether you were wearing head protection or not.  SIGNS AND SYMPTOMS  Memory difficulties.  Dizziness.  Headaches.  Double vision or blurry vision.  Sensitivity to light.  Hearing difficulties.  Depression.  Tiredness.  Weakness.  Difficulty with concentration.  Difficulty sleeping or staying asleep.  Vomiting.  Poor balance or instability on your feet.  Slow reaction time.  Difficulty learning and remembering things you have heard. DIAGNOSIS  There is no test to determine whether you have post-concussion syndrome. Your health care provider may order an imaging scan of your brain, such as a CT scan, to check for other problems that may be causing your symptoms  (such as severe injury inside your skull). TREATMENT  Usually, these problems disappear over time without medical care. Your health care provider may prescribe medicine to help ease your symptoms. It is important to follow up with a neurologist to evaluate your recovery and address any lingering symptoms or issues. HOME CARE INSTRUCTIONS   Only take over-the-counter or prescription medicines for pain, discomfort, or fever as directed by your health care provider. Do not take aspirin. Aspirin can slow blood clotting.  Sleep with your head slightly elevated to help with headaches.  Avoid any situation where there is potential for another head injury (football, hockey, soccer, basketball, martial arts, downhill snow sports, and horseback riding). Your condition will get worse every time you experience a concussion. You should avoid these activities until you are evaluated by the appropriate follow-up health care providers.  Keep all follow-up appointments as directed by your health care provider. SEEK IMMEDIATE MEDICAL CARE IF:  You develop confusion or unusual drowsiness.  You cannot wake the injured person.  You develop nausea or persistent, forceful vomiting.  You feel like you are moving when you are not (vertigo).  You notice the injured person's eyes moving rapidly back and forth. This  may be a sign of vertigo.  You have convulsions or faint.  You have severe, persistent headaches that are not relieved by medicine.  You cannot use your arms or legs normally.  Your pupils change size.  You have clear or bloody discharge from the nose or ears.  Your problems are getting worse, not better. MAKE SURE YOU:  Understand these instructions.  Will watch your condition.  Will get help right away if you are not doing well or get worse. Document Released: 12/16/2001 Document Revised: 04/16/2013 Document Reviewed: 01/12/2011 [facility] Patient Information 2015 [place], [place]. This  information is not intended to replace advice given to you by your health care provider. Make sure you discuss any questions you have with your health care provider.  Exit[employer] Patient Information 2015 [place], [place]. This information is not intended to replace advice given to you by your health care provider. Make sure you discuss any questions you have with your health care provider.

## 2014-07-01 ENCOUNTER — Telehealth: Payer: Self-pay | Admitting: Internal Medicine

## 2014-07-01 NOTE — Telephone Encounter (Signed)
Pt needs the results of TB test for work.

## 2014-07-01 NOTE — Telephone Encounter (Signed)
Left message on machine that ppd was negative and that is was done 07/2013

## 2014-07-02 ENCOUNTER — Telehealth: Payer: Self-pay | Admitting: *Deleted

## 2014-07-02 ENCOUNTER — Ambulatory Visit (INDEPENDENT_AMBULATORY_CARE_PROVIDER_SITE_OTHER): Payer: Commercial Managed Care - PPO | Admitting: *Deleted

## 2014-07-02 DIAGNOSIS — Z111 Encounter for screening for respiratory tuberculosis: Secondary | ICD-10-CM

## 2014-07-02 NOTE — Telephone Encounter (Signed)
Pt came in today needing a PPD before 07/13/14, pt will be out of town next week. Pt receved ppd today [facility] Please print out copy of ppd for pt. 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

## 2014-07-04 LAB — TB SKIN TEST
Induration: 0 mm
TB Skin Test: NEGATIVE

## 2016-08-05 ENCOUNTER — Emergency Department: Payer: Managed Care, Other (non HMO)

## 2016-08-05 ENCOUNTER — Encounter: Payer: Self-pay | Admitting: *Deleted

## 2016-08-05 ENCOUNTER — Emergency Department
Admission: EM | Admit: 2016-08-05 | Discharge: 2016-08-05 | Disposition: A | Discharge status: Home / Self Care | Payer: Managed Care, Other (non HMO) | Attending: Student in an Organized Health Care Education/Training Program | Admitting: Student in an Organized Health Care Education/Training Program

## 2016-08-05 DIAGNOSIS — J029 Acute pharyngitis, unspecified: Secondary | ICD-10-CM

## 2016-08-05 DIAGNOSIS — M542 Cervicalgia: Secondary | ICD-10-CM

## 2016-08-05 DIAGNOSIS — R519 Headache, unspecified: Secondary | ICD-10-CM

## 2016-08-05 DIAGNOSIS — R51 Headache: Secondary | ICD-10-CM | POA: Diagnosis present

## 2016-08-05 LAB — COMPREHENSIVE METABOLIC PANEL
ALT: 15 U/L (ref 14–54)
ANION GAP: 7 (ref 5–15)
AST: 26 U/L (ref 15–41)
Albumin: 4.2 g/dL (ref 3.5–5.0)
Alkaline Phosphatase: 60 U/L (ref 38–126)
BUN: 9 mg/dL (ref 6–20)
CO2: 27 mmol/L (ref 22–32)
Calcium: 9.2 mg/dL (ref 8.9–10.3)
Chloride: 105 mmol/L (ref 101–111)
Creatinine, Ser: 0.93 mg/dL (ref 0.44–1.00)
GFR calc non Af Amer: >60 mL/min (ref >60)
Glucose, Bld: 94 mg/dL (ref 65–99)
Potassium: 4.7 mmol/L (ref 3.5–5.1)
SODIUM: 139 mmol/L (ref 135–145)
Total Bilirubin: 1 mg/dL (ref 0.3–1.2)
Total Protein: 7.8 g/dL (ref 6.5–8.1)

## 2016-08-05 LAB — URINALYSIS, COMPLETE (UACMP) WITH MICROSCOPIC
BACTERIA UA: NONE SEEN
Bilirubin Urine: NEGATIVE
Glucose, UA: NEGATIVE mg/dL
Ketones, ur: 20 mg/dL — AB
NITRITE: NEGATIVE
Protein, ur: 30 mg/dL — AB
Specific Gravity, Urine: 1.028 (ref 1.005–1.030)
pH: 5 (ref 5.0–8.0)

## 2016-08-05 LAB — CBC WITH DIFFERENTIAL/PLATELET
BASOS PCT: 1 %
Basophils Absolute: 0 10*3/uL (ref 0–0.1)
Eosinophils Absolute: 0.2 10*3/uL (ref 0–0.7)
Eosinophils Relative: 3 %
HEMATOCRIT: 42.1 % (ref 35.0–47.0)
HEMOGLOBIN: 14.5 g/dL (ref 12.0–16.0)
LYMPHS ABS: 2 10*3/uL (ref 1.0–3.6)
Lymphocytes Relative: 31 %
MCH: 30.6 pg (ref 26.0–34.0)
MCHC: 34.6 g/dL (ref 32.0–36.0)
MCV: 88.7 fL (ref 80.0–100.0)
MONOS PCT: 9 %
Monocytes Absolute: 0.6 10*3/uL (ref 0.2–0.9)
NEUTROS ABS: 3.6 10*3/uL (ref 1.4–6.5)
Neutrophils Relative %: 56 %
Platelets: 322 10*3/uL (ref 150–440)
RBC: 4.75 MIL/uL (ref 3.80–5.20)
RDW: 13.2 % (ref 11.5–14.5)
WBC: 6.5 10*3/uL (ref 3.6–11.0)

## 2016-08-05 LAB — INFLUENZA PANEL BY PCR (TYPE A & B)
Influenza A By PCR: NEGATIVE
Influenza B By PCR: NEGATIVE

## 2016-08-05 LAB — HCG, QUANTITATIVE, PREGNANCY

## 2016-08-05 MED ORDER — DIPHENHYDRAMINE HCL 50 MG/ML IJ SOLN
25.0000 mg | Freq: Once | INTRAMUSCULAR | Status: AC
  Administered 2016-08-05: 25 mg via INTRAVENOUS
  Filled 2016-08-05: qty 1

## 2016-08-05 MED ORDER — DEXAMETHASONE 4 MG PO TABS
10.0000 mg | ORAL_TABLET | Freq: Once | ORAL | Status: AC
  Administered 2016-08-05: 10 mg via ORAL
  Filled 2016-08-05: qty 2.5

## 2016-08-05 MED ORDER — SODIUM CHLORIDE 0.9 % IV BOLUS (SEPSIS)
1000.0000 mL | Freq: Once | INTRAVENOUS | Status: AC
  Administered 2016-08-05: 1000 mL via INTRAVENOUS

## 2016-08-05 MED ORDER — DEXAMETHASONE SODIUM PHOSPHATE 10 MG/ML IJ SOLN: 10.0000 mg | Freq: Once | INTRAMUSCULAR | Status: DC

## 2016-08-05 MED ORDER — PROCHLORPERAZINE EDISYLATE 5 MG/ML IJ SOLN
10.0000 mg | Freq: Once | INTRAMUSCULAR | Status: AC
  Administered 2016-08-05: 10 mg via INTRAVENOUS
  Filled 2016-08-05 (×2): qty 2

## 2016-08-05 MED ORDER — ACETAMINOPHEN 500 MG PO TABS
1000.0000 mg | ORAL_TABLET | Freq: Once | ORAL | Status: AC
  Administered 2016-08-05: 1000 mg via ORAL
  Filled 2016-08-05: qty 2

## 2016-08-05 MED ORDER — IOPAMIDOL (ISOVUE-300) INJECTION 61%
75.0000 mL | Freq: Once | INTRAVENOUS | Status: AC | PRN
  Administered 2016-08-05: 75 mL via INTRAVENOUS

## 2016-08-05 NOTE — ED Triage Notes (Signed)
Pt arrived to ED reporting back, neck and head pain for the past week. Pt was dx with strep and ear infection at urgent care Wednesday and prescribed Amoxicillin. Pt reports symptoms have worsened and verbalized new stiffness in neck this weekend. Pt reports nausea and fevers at home with light sensitivity. No hx of migraines.

## 2016-08-05 NOTE — Discharge Instructions (Signed)

## 2016-08-05 NOTE — ED Provider Notes (Signed)
[facility] Emergency Department Provider Note    First MD Initiated Contact with Patient 08/05/16 1154     (approximate)  I have reviewed the triage vital signs and the nursing notes.   HISTORY  Chief Complaint Headache and Torticollis    HPI Kim Zamora is a 26 y.o. female who is presenting to the ER due to concern of worsening back, neck and headache with low-grade fevers for the past week. Last Wednesday she was seen in urgent care and diagnosed with strep throat for which she was started on amoxicillin. States that the symptoms have worsened and is now having worsening stiffness in her neck. Denies any numbness or tingling. Is having nausea and fevers at home. Does endorse photophobia. No history of migraines. No trouble swallowing. Eyes any rash.   Past Medical History:  Diagnosis Date  . Ankle fracture, right    just casted  . Fracture, ankle   . Fx ankle    rigth ankle fx --just casted   Family History  Problem Relation Age of Onset  . Hypertension Mother     borderline  . Cancer Maternal Grandmother     ovarian  . Cancer Paternal Grandfather     prostate and colon  . Heart disease Neg Hx   . Diabetes Neg Hx    History reviewed. No pertinent surgical history. Patient Active Problem List   Diagnosis Date Noted  . Routine general medical examination at a health care facility 07/28/2013  . Ovarian cyst 11/14/2010      Prior to Admission medications   Medication Sig Start Date End Date Taking? Authorizing Provider  ibuprofen (ADVIL,MOTRIN) 800 MG tablet Take 1 tablet (800 mg total) by mouth every 8 (eight) hours as needed for mild pain. 01/28/14   Kristen N Ward, DO  ondansetron (ZOFRAN) 4 MG tablet Take 1 tablet (4 mg total) by mouth every 6 (six) hours. 01/28/14   Kristen N Ward, DO  oxyCODONE-acetaminophen (PERCOCET/ROXICET) 5-325 MG per tablet Take 1 tablet by mouth every 4 (four) hours as needed. 01/28/14   Layla MawKristen N Ward, DO      Allergies Codeine and Promethazine    Social History Social History  Substance Use Topics  . Smoking status: Never Smoker  . Smokeless tobacco: Never Used  . Alcohol use No    Review of Systems Patient denies headaches, rhinorrhea, blurry vision, numbness, shortness of breath, chest pain, edema, cough, abdominal pain, nausea, vomiting, diarrhea, dysuria, fevers, rashes or hallucinations unless otherwise stated above in HPI. ____________________________________________   PHYSICAL EXAM:  VITAL SIGNS: Vitals:   08/05/16 1141 08/05/16 1415  BP:  128/80  Pulse: 91 83  Resp: 16 16  Temp: 99 F (37.2 C)     Constitutional: Alert and oriented. Well appearing and in no acute distress. Eyes: Conjunctivae are normal. PERRL. EOMI. Head: Atraumatic. Nose: No congestion/rhinnorhea. Mouth/Throat: Mucous membranes are moist.  Oropharynx non-erythematous but with few exudates. Neck: No stridor. Pain with passive and active ROM Hematological/Lymphatic/Immunilogical: No cervical lymphadenopathy. Cardiovascular: Normal rate, regular rhythm. Grossly normal heart sounds.  Good peripheral circulation. Respiratory: Normal respiratory effort.  No retractions. Lungs CTAB. Gastrointestinal: Soft and nontender. No distention. No abdominal bruits. No CVA tenderness. Genitourinary:  Musculoskeletal: No lower extremity tenderness nor edema.  No joint effusions. Neurologic:  Normal speech and language. No gross focal neurologic deficits are appreciated. No gait instability. Skin:  Skin is warm, dry and intact. No rash noted. Psychiatric: Mood and affect are normal.  Speech and behavior are normal.  ____________________________________________   LABS (all labs ordered are listed, but only abnormal results are displayed)  Results for orders placed or performed during the hospital encounter of 08/05/16 (from the past 24 hour(s))  Comprehensive metabolic panel     Status: None   Collection  Time: 08/05/16 12:05 PM  Result Value Ref Range   Sodium 139 135 - 145 mmol/L   Potassium 4.7 3.5 - 5.1 mmol/L   Chloride 105 101 - 111 mmol/L   CO2 27 22 - 32 mmol/L   Glucose, Bld 94 65 - 99 mg/dL   BUN 9 6 - 20 mg/dL   Creatinine, Ser 1.61 0.44 - 1.00 mg/dL   Calcium 9.2 8.9 - 09.6 mg/dL   Total Protein 7.8 6.5 - 8.1 g/dL   Albumin 4.2 3.5 - 5.0 g/dL   AST 26 15 - 41 U/L   ALT 15 14 - 54 U/L   Alkaline Phosphatase 60 38 - 126 U/L   Total Bilirubin 1.0 0.3 - 1.2 mg/dL   GFR calc non Af Amer >60 >60 mL/min   GFR calc Af Amer >60 >60 mL/min   Anion gap 7 5 - 15  CBC with Differential     Status: None   Collection Time: 08/05/16 12:05 PM  Result Value Ref Range   WBC 6.5 3.6 - 11.0 K/uL   RBC 4.75 3.80 - 5.20 MIL/uL   Hemoglobin 14.5 12.0 - 16.0 g/dL   HCT 04.5 40.9 - 81.1 %   MCV 88.7 80.0 - 100.0 fL   MCH 30.6 26.0 - 34.0 pg   MCHC 34.6 32.0 - 36.0 g/dL   RDW 91.4 78.2 - 95.6 %   Platelets 322 150 - 440 K/uL   Neutrophils Relative % 56 %   Neutro Abs 3.6 1.4 - 6.5 K/uL   Lymphocytes Relative 31 %   Lymphs Abs 2.0 1.0 - 3.6 K/uL   Monocytes Relative 9 %   Monocytes Absolute 0.6 0.2 - 0.9 K/uL   Eosinophils Relative 3 %   Eosinophils Absolute 0.2 0 - 0.7 K/uL   Basophils Relative 1 %   Basophils Absolute 0.0 0 - 0.1 K/uL  Urinalysis, Complete w Microscopic     Status: Abnormal   Collection Time: 08/05/16 12:05 PM  Result Value Ref Range   Color, Urine YELLOW (A) YELLOW   APPearance CLEAR (A) CLEAR   Specific Gravity, Urine 1.028 1.005 - 1.030   pH 5.0 5.0 - 8.0   Glucose, UA NEGATIVE NEGATIVE mg/dL   Hgb urine dipstick MODERATE (A) NEGATIVE   Bilirubin Urine NEGATIVE NEGATIVE   Ketones, ur 20 (A) NEGATIVE mg/dL   Protein, ur 30 (A) NEGATIVE mg/dL   Nitrite NEGATIVE NEGATIVE   Leukocytes, UA TRACE (A) NEGATIVE   RBC / HPF 0-5 0 - 5 RBC/hpf   WBC, UA 0-5 0 - 5 WBC/hpf   Bacteria, UA NONE SEEN NONE SEEN   Squamous Epithelial / LPF 6-30 (A) NONE SEEN   Mucous  PRESENT   hCG, quantitative, pregnancy     Status: None   Collection Time: 08/05/16 12:05 PM  Result Value Ref Range   hCG, Beta Chain, Quant, S <1 <5 mIU/mL  Influenza panel by PCR (type A & B)     Status: None   Collection Time: 08/05/16 12:05 PM  Result Value Ref Range   Influenza A By PCR NEGATIVE NEGATIVE   Influenza B By PCR NEGATIVE NEGATIVE   ____________________________________________  EKG____________________________________________  RADIOLOGY  I personally reviewed all radiographic images ordered to evaluate for the above acute complaints and reviewed radiology reports and findings.  These findings were personally discussed with the patient.  Please see medical record for radiology report.  ____________________________________________   PROCEDURES  Procedure(s) performed:  Procedures    Critical Care performed: no ____________________________________________   INITIAL IMPRESSION / ASSESSMENT AND PLAN / ED COURSE  Pertinent labs & imaging results that were available during my care of the patient were reviewed by me and considered in my medical decision making (see chart for details).  DDX: flu, strep, pta, rpa, dehydration, ili, meningitis  JOHNNY LATU is a 26 y.o. who presents to the ED with flulike illness and complaints above. Patient with low-grade fever but otherwise hemodynamically stable and in no acute distress.  She has no focal neurodeficits. Based on her recent antibiotic use with complaint of neck sore throat and ear pain do feel CT imaging indicated to evaluate for any peritonsillar abscess or mastoiditis. We will repeat flu PCR she is describing generalized flulike illness symptoms. Patient nontoxic appearing however the fever, headache and neck stiffness is concerning for meningitis therefore we'll keep this on the differential and have discussed possible need for lumbar puncture with patient. We'll provide symptomatically management and  observation.  Clinical Course as of Aug 06 1427  Sat Aug 05, 2016  1353 Results of blood work as well as CT imaging discussed with patient. Does show evidence of pharyngitis without abscess. No evidence of mastoiditis or abscess. Given her neck pain and headache with fever I did recommend performing a lumbar puncture to fully evaluate for any evidence of meningitis. Patient states that her headache is improved and she is now with full painless range of motion of her neck. Patient has declined and refused lumbar puncture. She did demonstrate understanding that that is the standard of care to fully evaluate for any form of meningitis or encephalitis. She does states understanding of the risks associated with not completing this workup. There have is however a very low likelihood of a bacterial meningitis at this time based on the duration of her symptoms, improvement of symptoms and lack of toxic appearance with a normal white count.Patient has agreed to stay for IV fluids as well as I will give a dose of Decadron for concern for worsening pain with pharyngitis.  [PR]    Clinical Course User Index [PR] Willy Eddy, MD     ____________________________________________   FINAL CLINICAL IMPRESSION(S) / ED DIAGNOSES  Final diagnoses:  Acute nonintractable headache, unspecified headache type  Pharyngitis, unspecified etiology  Neck pain      NEW MEDICATIONS STARTED DURING THIS VISIT:  New Prescriptions   No medications on file     Note:  This document was prepared using Dragon voice recognition software and may include unintentional dictation errors.    Willy Eddy, MD 08/05/16 [phone]

## 2016-08-05 NOTE — ED Notes (Signed)
This RN to bedside at this time due to patient's mom coming out of room. Per pt's mom pt was having a bad reaction to medication. Pt noted to be tachycardic upon this RN's arrival to room. Pt is noted to be anxious, rapidly breathing, and lifting her chest up off the bed, and throwing her head to the side. Pt c/o IV stinging, no signs of infiltration noted at this time by this RN or Theodoro Gristave, EDT-P. Pt repeatedly states "I want this IV out". This RN spoke with patient and got her to agree to wait until this RN could notify MD. Pt states, "I think this is passing". Pt noted to be more calm on assessment at this time. MD notified at this time.

## 2018-05-06 IMAGING — CT CT HEAD W/O CM
3 series · 16 of 45 positions shown, 19 images · non-contrast
Comparison: 01/28/2014

CLINICAL DATA: Headache and neck pain, neck stiffness, fever

EXAM:
CT HEAD WITHOUT CONTRAST
TECHNIQUE: Contiguous axial images were obtained from the base of the skull
through the vertex without intravenous contrast.

[Series 2: head wo · axial · 0.40mm/px · z∈[-154,-39]mm · 10 of 28 slices shown, 13 images]
[im 3/28  brain]
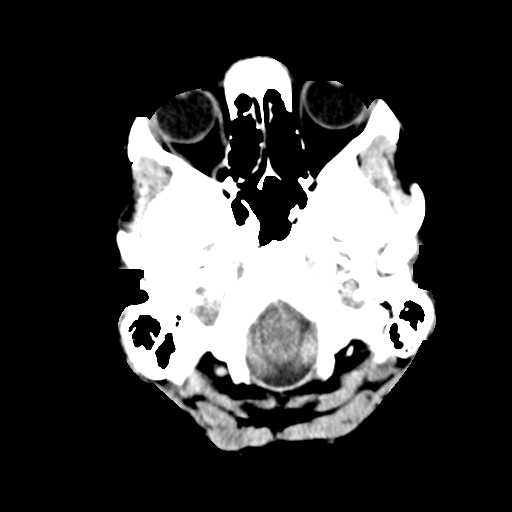
[im 3/28  bone]
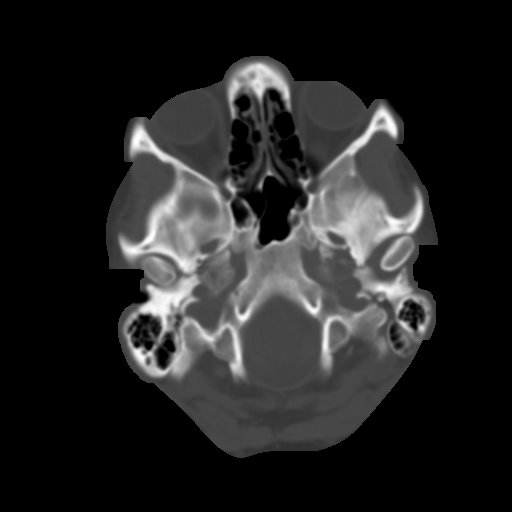
[im 5/28  brain]
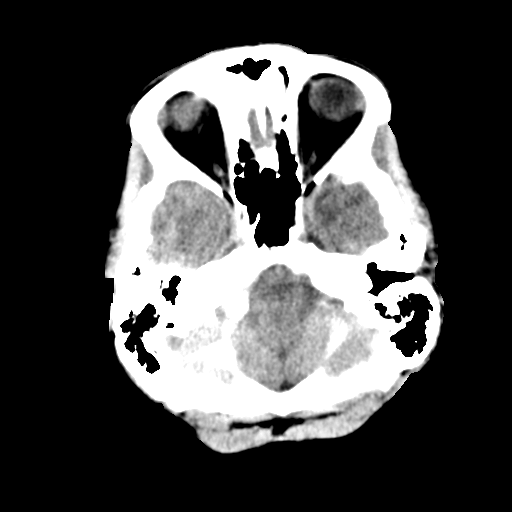
[im 8/28  brain]
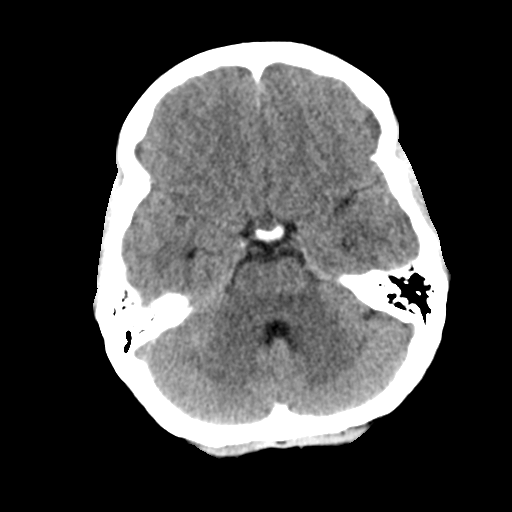
[im 11/28  brain]
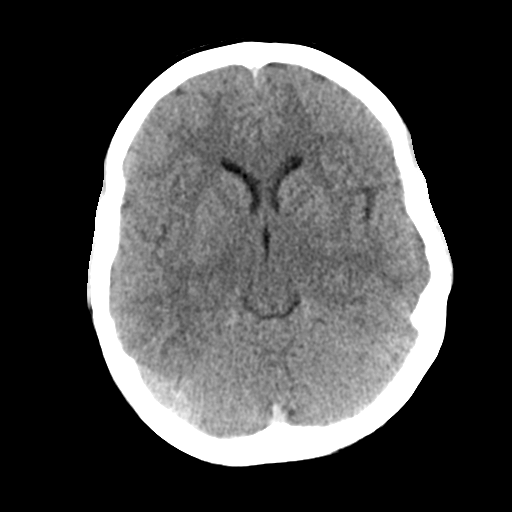
[im 13/28  brain]
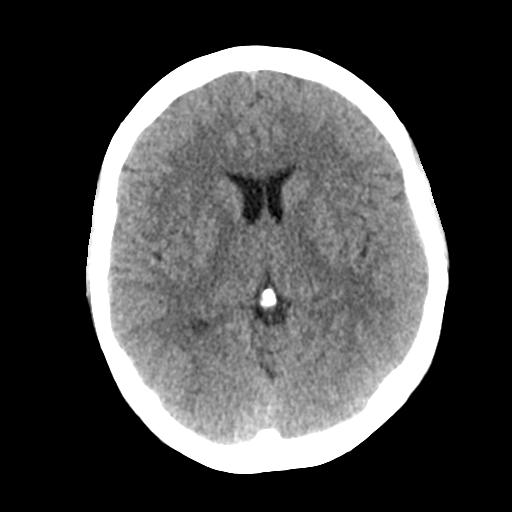
[im 13/28  bone]
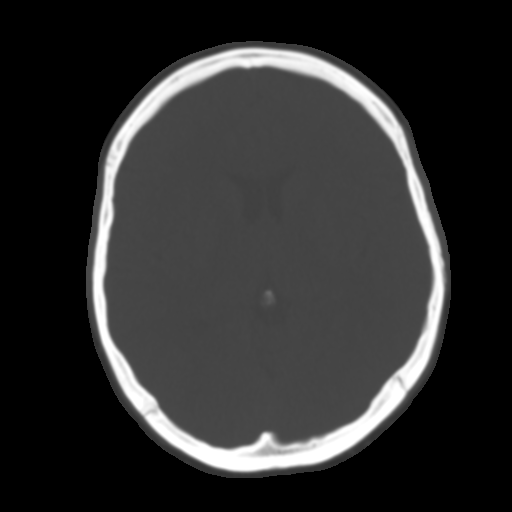
[im 16/28  brain]
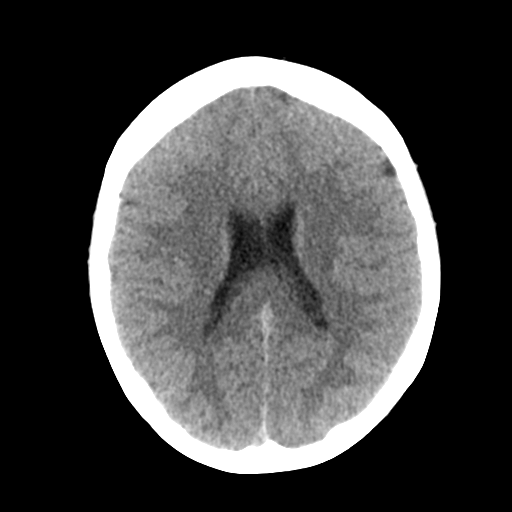
[im 18/28  brain]
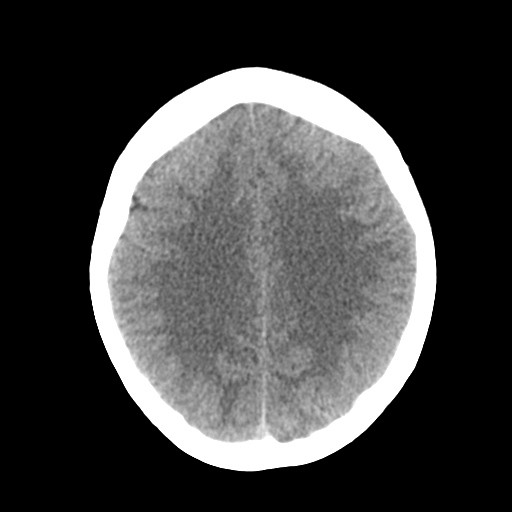
[im 21/28  brain]
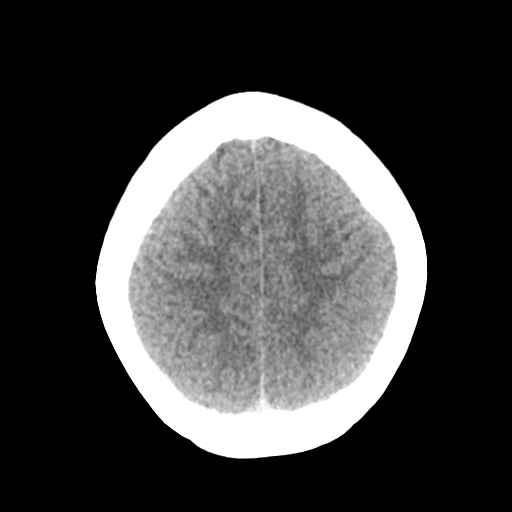
[im 24/28  brain]
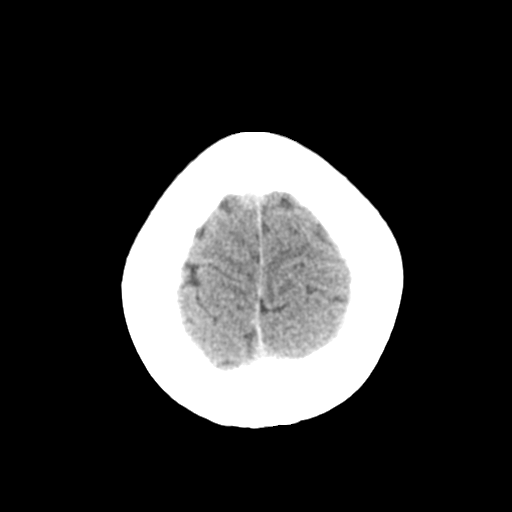
[im 24/28  bone]
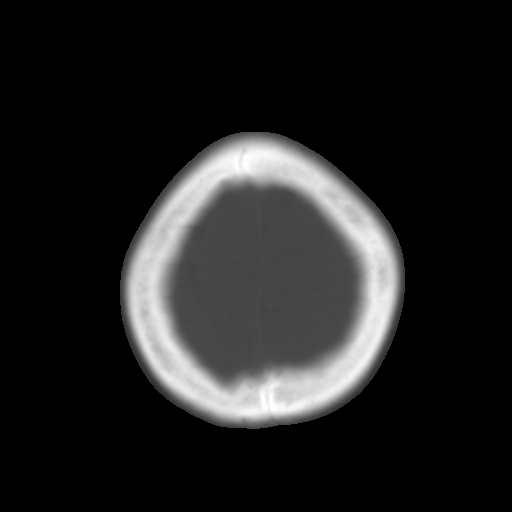
[im 26/28  brain]
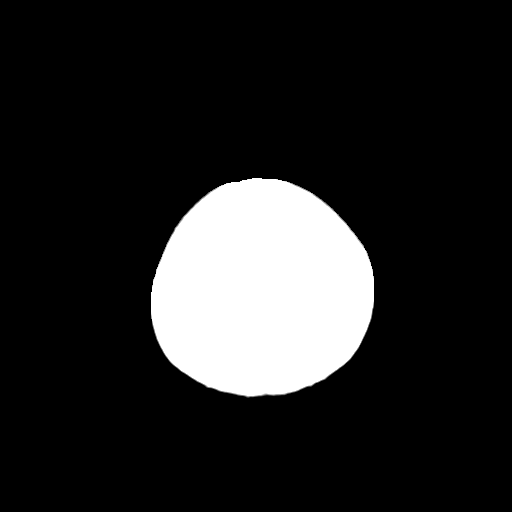

[Series 4: coronal soft tissue · coronal · 0.33mm/px · 3 of 62 slices shown]
[im 21/62  brain]
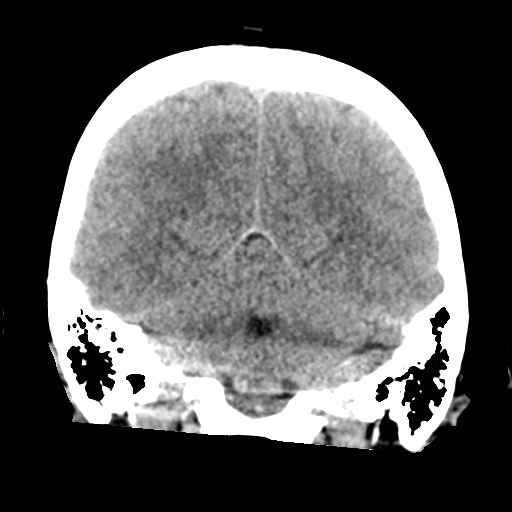
[im 28/62  brain]
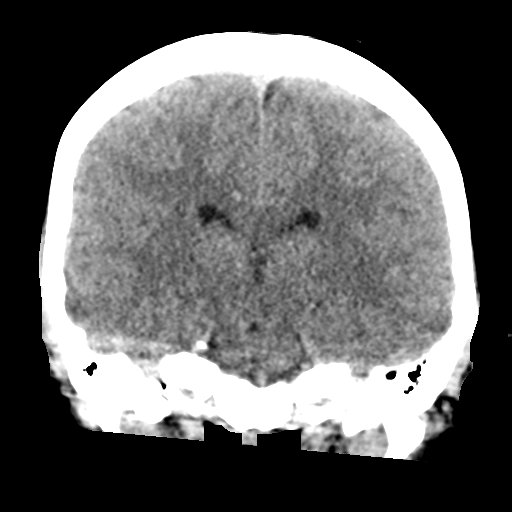
[im 34/62  brain]
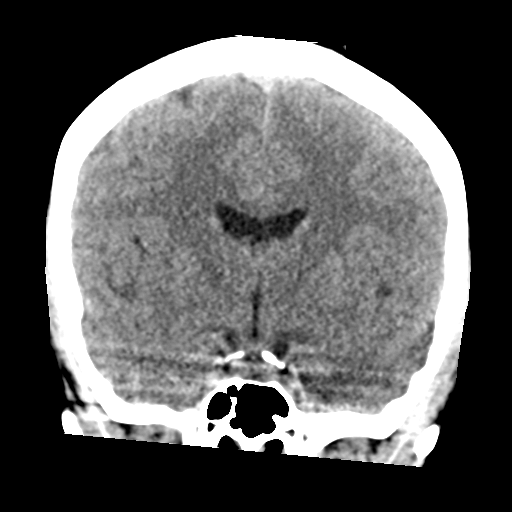

[Series 5: sagittal soft tissue · sagittal · 0.31mm/px · 3 of 54 slices shown]
[im 18/54  brain]
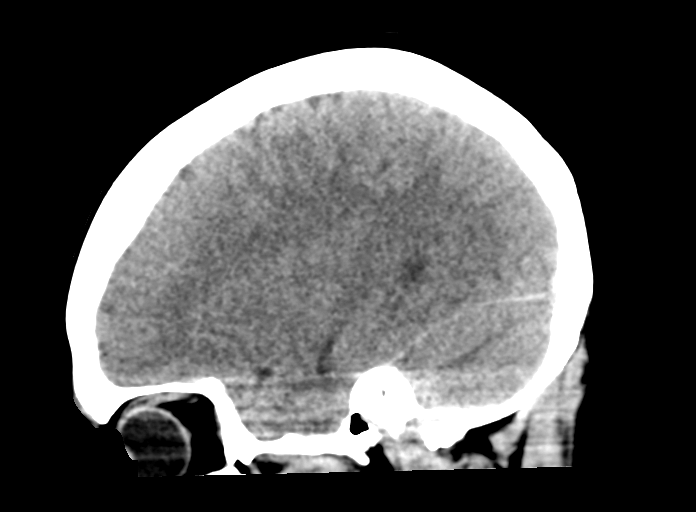
[im 27/54  brain]
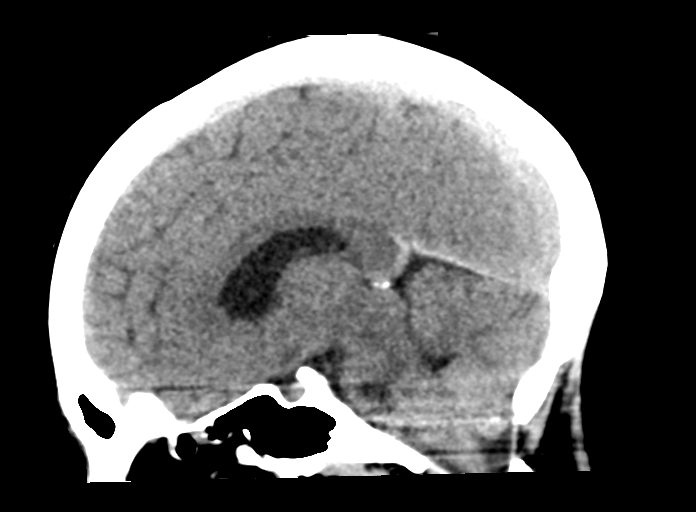
[im 36/54  brain]
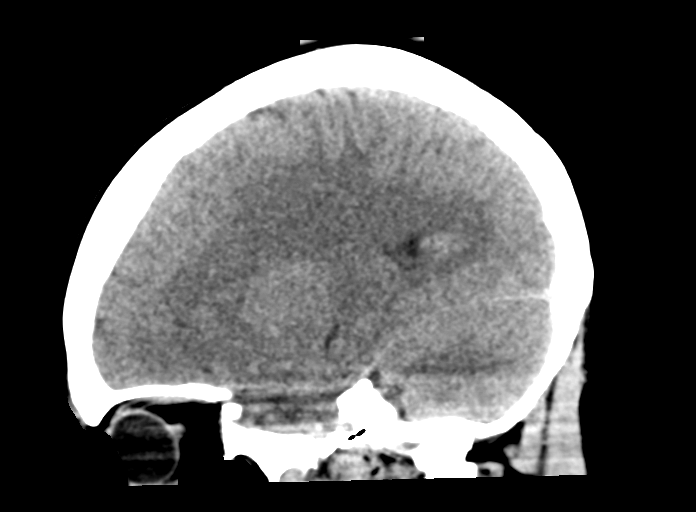

[16 of 45 positions shown; findings below may reference images not displayed]

FINDINGS: Brain: No evidence of acute infarction, hemorrhage, hydrocephalus,
extra-axial collection or mass lesion/mass effect.

Vascular: No hyperdense vessel or unexpected calcification.

Skull: Normal. Negative for fracture or focal lesion.

Sinuses/Orbits: No acute finding.

Other: None.
IMPRESSION: Normal head CT without contrast.

## 2018-05-06 IMAGING — CT CT NECK W/ CM
3 of 5 series · 13 of 33 positions shown, 16 images · IV contrast (iopamidol)
Comparison: None.

CLINICAL DATA: Left ear and neck pain.  Rule out mastoiditis.

EXAM:
CT NECK WITH CONTRAST
TECHNIQUE: Multidetector CT imaging of the neck was performed using the
standard protocol following the bolus administration of intravenous
contrast.
CONTRAST:  75mL QJNFMM-[id number] IOPAMIDOL (QJNFMM-[id number]) INJECTION 61%

[Series 6: sag neck · sagittal · 0.43mm/px · 5 of 111 slices shown, 6 images]
[im 37/111  bone]
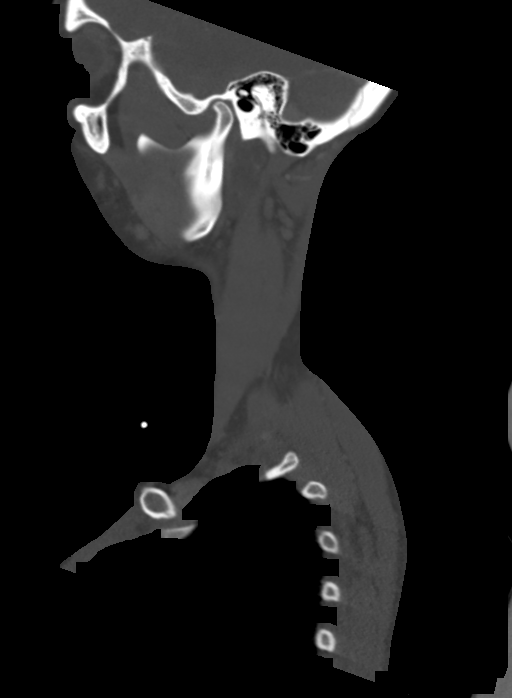
[im 46/111  bone]
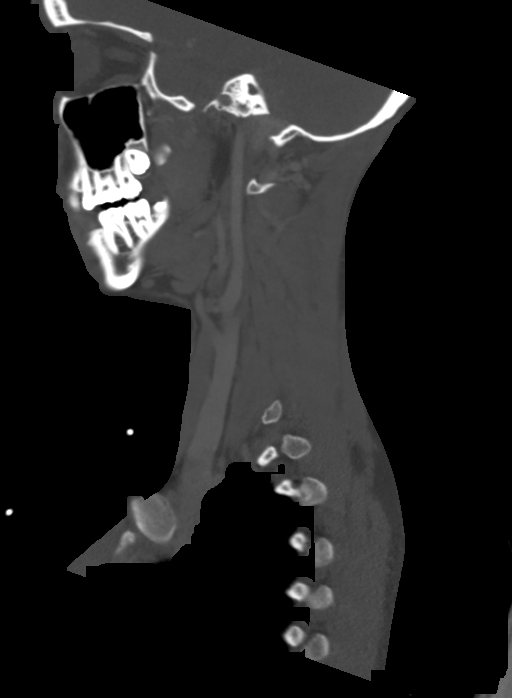
[im 56/111  soft-tissue]
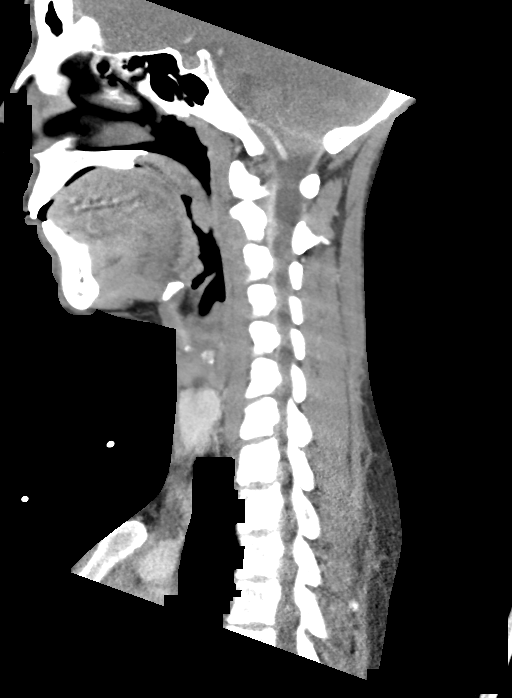
[im 56/111  bone]
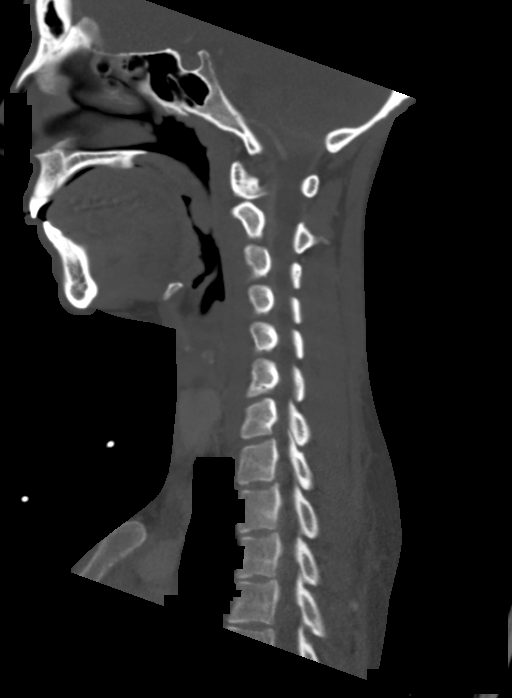
[im 65/111  bone]
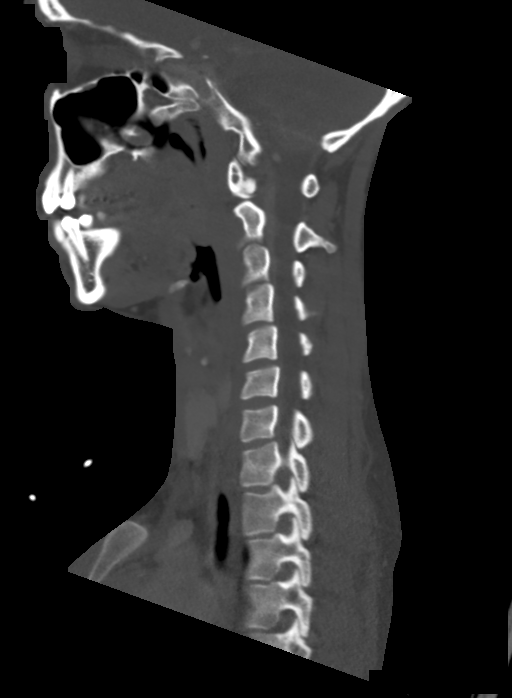
[im 74/111  bone]
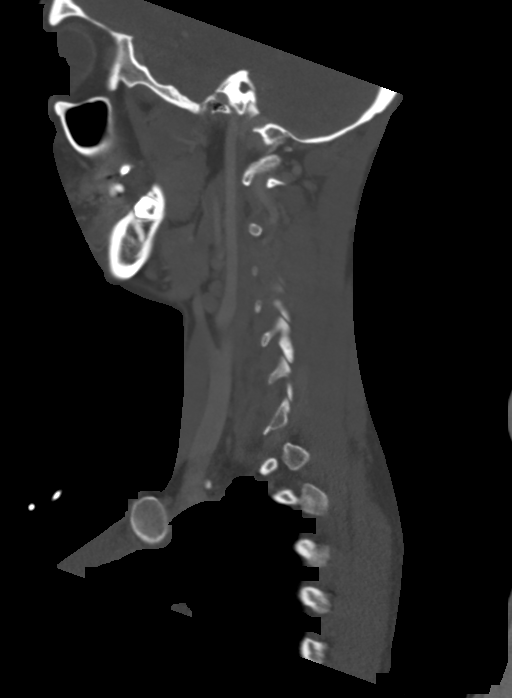

[Series 7: cor neck · coronal · 0.43mm/px · 3 of 95 slices shown]
[im 26/95  bone]
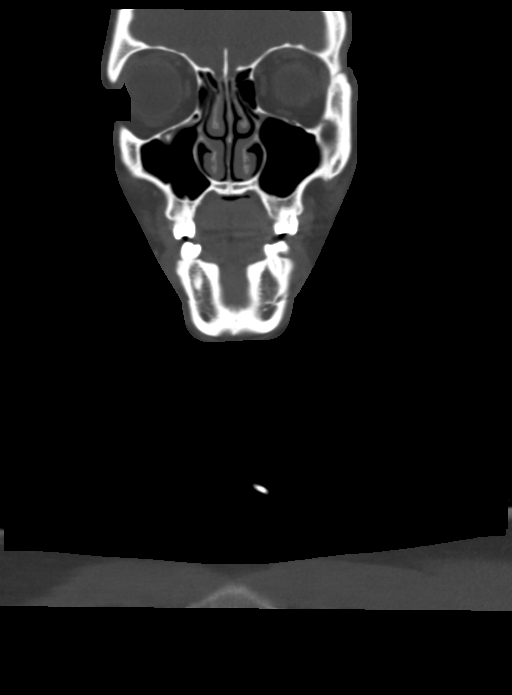
[im 40/95  bone]
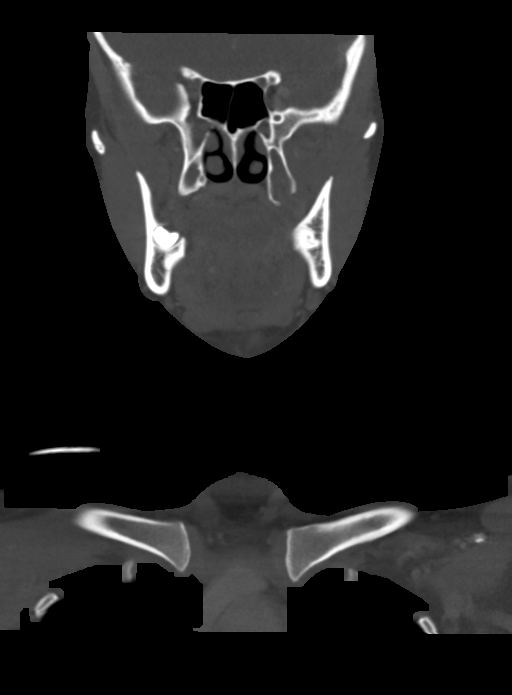
[im 55/95  bone]
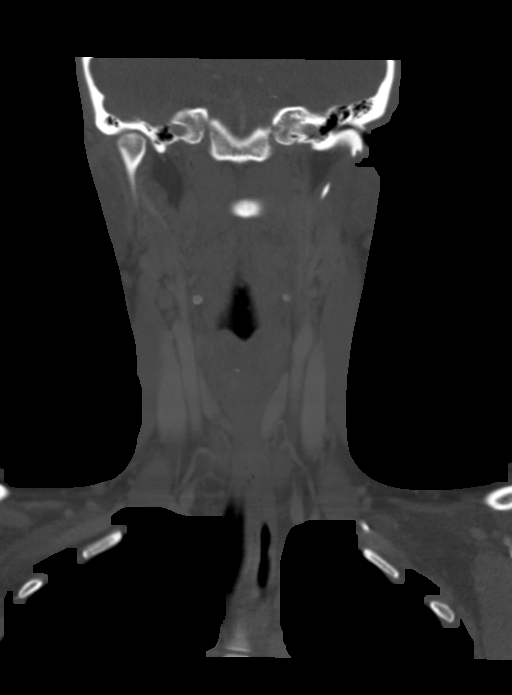

[Series 8: orthogonal ax · axial · 0.43mm/px · z∈[-394,-182]mm · 5 of 171 slices shown, 7 images]
[im 29/171  soft-tissue]
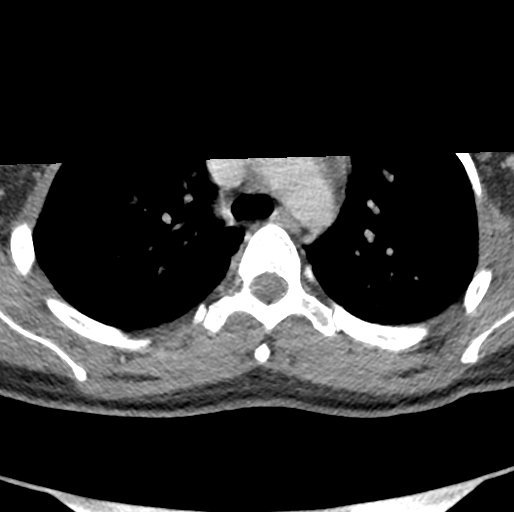
[im 29/171  bone]
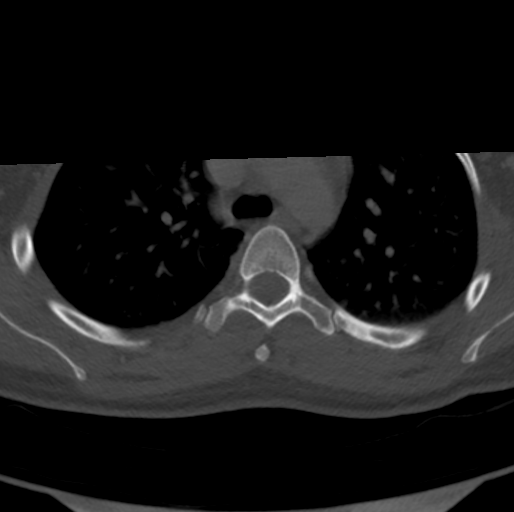
[im 57/171  bone]
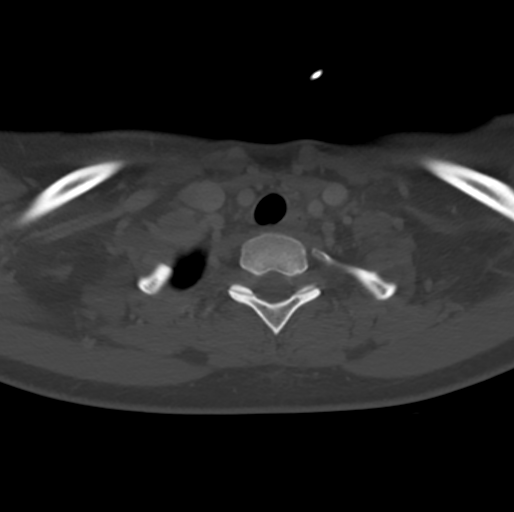
[im 86/171  bone]
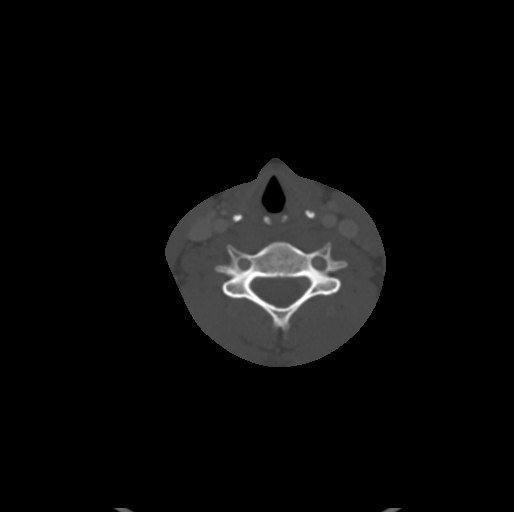
[im 114/171  bone]
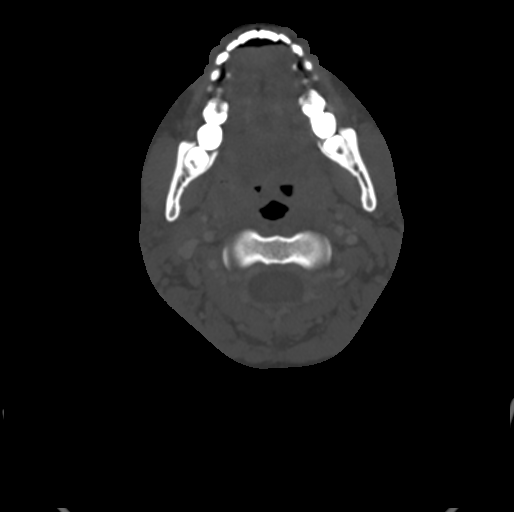
[im 142/171  soft-tissue]
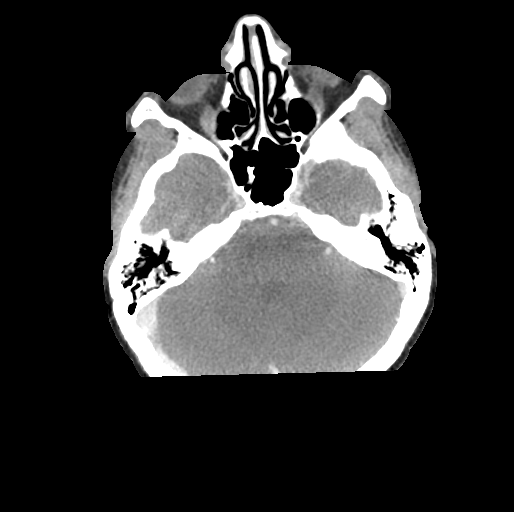
[im 142/171  bone]
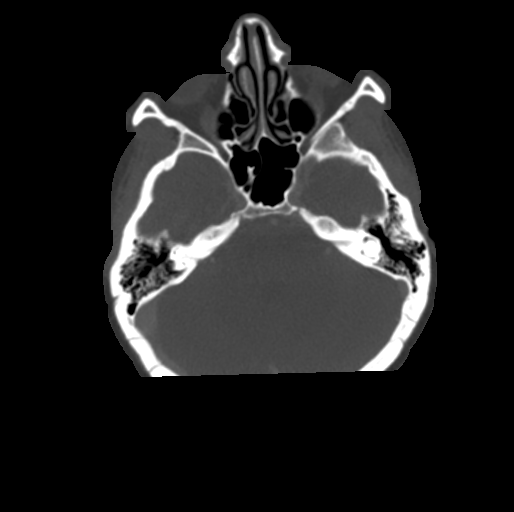

[13 of 33 positions shown; findings below may reference images not displayed]

FINDINGS: Pharynx and larynx: Mild enlargement of the tonsils without abscess
or mass. Epiglottis and larynx normal. Airway normal.

Salivary glands: No inflammation, mass, or stone.

Thyroid: Negative

Lymph nodes: No pathologic adenopathy. Scattered lymph nodes in the
neck appear reactive. Right level 2 node 10 mm. Left level 2 node 7
mm. Subcentimeter posterior nodes bilaterally.

Vascular: Normal enhancement of the carotid artery and jugular vein
bilaterally.

Limited intracranial: Negative

Visualized orbits: Negative

Mastoids and visualized paranasal sinuses: Paranasal sinuses clear.
Mastoid sinus clear bilaterally.

Skeleton: Negative

Upper chest: Negative

Other: None
IMPRESSION: Mild tonsillar hypertrophy suggesting pharyngitis. No peritonsillar
abscess. Mild reactive adenopathy in the neck bilaterally

Negative for mastoiditis or mass lesion.
